# Patient Record
Sex: Female | Born: 1950 | Race: White | Hispanic: No | Marital: Married | Smoking: Never smoker
Health system: Southern US, Community
[De-identification: ages and names within clinical notes are randomized; demographics above are authoritative.]

## PROBLEM LIST (undated history)

## (undated) DIAGNOSIS — R002 Palpitations: Secondary | ICD-10-CM

## (undated) DIAGNOSIS — Z806 Family history of leukemia: Secondary | ICD-10-CM

## (undated) DIAGNOSIS — G500 Trigeminal neuralgia: Secondary | ICD-10-CM

## (undated) DIAGNOSIS — Z800 Family history of malignant neoplasm of digestive organs: Secondary | ICD-10-CM

## (undated) DIAGNOSIS — M47812 Spondylosis without myelopathy or radiculopathy, cervical region: Secondary | ICD-10-CM

## (undated) DIAGNOSIS — Z8042 Family history of malignant neoplasm of prostate: Secondary | ICD-10-CM

## (undated) DIAGNOSIS — I499 Cardiac arrhythmia, unspecified: Secondary | ICD-10-CM

## (undated) DIAGNOSIS — R197 Diarrhea, unspecified: Secondary | ICD-10-CM

## (undated) DIAGNOSIS — R1110 Vomiting, unspecified: Secondary | ICD-10-CM

## (undated) DIAGNOSIS — Z803 Family history of malignant neoplasm of breast: Secondary | ICD-10-CM

## (undated) DIAGNOSIS — K589 Irritable bowel syndrome without diarrhea: Secondary | ICD-10-CM

## (undated) DIAGNOSIS — Z8481 Family history of carrier of genetic disease: Secondary | ICD-10-CM

## (undated) DIAGNOSIS — I498 Other specified cardiac arrhythmias: Secondary | ICD-10-CM

## (undated) DIAGNOSIS — M869 Osteomyelitis, unspecified: Secondary | ICD-10-CM

## (undated) HISTORY — DX: Irritable bowel syndrome, unspecified: K58.9

## (undated) HISTORY — PX: TONSILLECTOMY: SUR1361

## (undated) HISTORY — DX: Other specified cardiac arrhythmias: I49.8

## (undated) HISTORY — DX: Osteomyelitis, unspecified: M86.9

## (undated) HISTORY — DX: Spondylosis without myelopathy or radiculopathy, cervical region: M47.812

## (undated) HISTORY — DX: Family history of malignant neoplasm of breast: Z80.3

## (undated) HISTORY — PX: TUBAL LIGATION: SHX77

## (undated) HISTORY — PX: UPPER GASTROINTESTINAL ENDOSCOPY: SHX188

## (undated) HISTORY — DX: Diarrhea, unspecified: R19.7

## (undated) HISTORY — DX: Trigeminal neuralgia: G50.0

## (undated) HISTORY — DX: Cardiac arrhythmia, unspecified: I49.9

## (undated) HISTORY — DX: Family history of carrier of genetic disease: Z84.81

## (undated) HISTORY — DX: Palpitations: R00.2

## (undated) HISTORY — PX: KNEE SURGERY: SHX244

## (undated) HISTORY — DX: Family history of leukemia: Z80.6

## (undated) HISTORY — DX: Family history of malignant neoplasm of digestive organs: Z80.0

## (undated) HISTORY — DX: Vomiting, unspecified: R11.10

## (undated) HISTORY — PX: CERVICAL DISC SURGERY: SHX588

## (undated) HISTORY — DX: Family history of malignant neoplasm of prostate: Z80.42

## (undated) HISTORY — PX: COLONOSCOPY: SHX174

---

## 1997-08-09 ENCOUNTER — Ambulatory Visit: Admission: RE | Admit: 1997-08-09 | Discharge: 1997-08-09 | Payer: Self-pay | Admitting: Gynecology

## 1997-11-26 ENCOUNTER — Encounter: Payer: Self-pay | Admitting: Neurosurgery

## 1997-11-29 ENCOUNTER — Inpatient Hospital Stay (HOSPITAL_COMMUNITY): Admission: RE | Admit: 1997-11-29 | Discharge: 1997-11-30 | Payer: Self-pay | Admitting: Neurosurgery

## 1997-11-29 ENCOUNTER — Encounter: Payer: Self-pay | Admitting: Neurosurgery

## 1997-12-01 ENCOUNTER — Inpatient Hospital Stay (HOSPITAL_COMMUNITY): Admission: EM | Admit: 1997-12-01 | Discharge: 1997-12-05 | Payer: Self-pay | Admitting: Neurosurgery

## 1998-09-22 ENCOUNTER — Encounter: Payer: Self-pay | Admitting: Gynecology

## 1998-09-22 ENCOUNTER — Ambulatory Visit (HOSPITAL_COMMUNITY): Admission: RE | Admit: 1998-09-22 | Discharge: 1998-09-22 | Payer: Self-pay | Admitting: Gynecology

## 1998-09-26 ENCOUNTER — Other Ambulatory Visit: Admission: RE | Admit: 1998-09-26 | Discharge: 1998-09-26 | Payer: Self-pay | Admitting: Gynecology

## 1999-02-17 ENCOUNTER — Encounter: Admission: RE | Admit: 1999-02-17 | Discharge: 1999-02-17 | Payer: Self-pay | Admitting: Neurosurgery

## 1999-02-17 ENCOUNTER — Encounter: Payer: Self-pay | Admitting: Neurosurgery

## 1999-04-06 ENCOUNTER — Encounter: Admission: RE | Admit: 1999-04-06 | Discharge: 1999-07-05 | Payer: Self-pay | Admitting: Anesthesiology

## 1999-09-22 ENCOUNTER — Ambulatory Visit (HOSPITAL_COMMUNITY): Admission: RE | Admit: 1999-09-22 | Discharge: 1999-09-22 | Payer: Self-pay | Admitting: Gynecology

## 1999-09-22 ENCOUNTER — Encounter: Payer: Self-pay | Admitting: Gynecology

## 1999-12-05 ENCOUNTER — Other Ambulatory Visit: Admission: RE | Admit: 1999-12-05 | Discharge: 1999-12-05 | Payer: Self-pay | Admitting: Gynecology

## 2000-04-15 ENCOUNTER — Encounter: Admission: RE | Admit: 2000-04-15 | Discharge: 2000-07-14 | Payer: Self-pay | Admitting: Anesthesiology

## 2000-10-16 ENCOUNTER — Ambulatory Visit (HOSPITAL_COMMUNITY): Admission: RE | Admit: 2000-10-16 | Discharge: 2000-10-16 | Payer: Self-pay | Admitting: Gynecology

## 2000-10-16 ENCOUNTER — Encounter: Payer: Self-pay | Admitting: Gynecology

## 2001-03-18 ENCOUNTER — Other Ambulatory Visit: Admission: RE | Admit: 2001-03-18 | Discharge: 2001-03-18 | Payer: Self-pay | Admitting: Gynecology

## 2001-11-26 ENCOUNTER — Ambulatory Visit (HOSPITAL_COMMUNITY): Admission: RE | Admit: 2001-11-26 | Discharge: 2001-11-26 | Payer: Self-pay | Admitting: Gynecology

## 2001-11-26 ENCOUNTER — Encounter: Payer: Self-pay | Admitting: Gynecology

## 2001-12-29 ENCOUNTER — Ambulatory Visit (HOSPITAL_COMMUNITY): Admission: RE | Admit: 2001-12-29 | Discharge: 2001-12-29 | Payer: Self-pay | Admitting: Gastroenterology

## 2001-12-29 ENCOUNTER — Encounter (INDEPENDENT_AMBULATORY_CARE_PROVIDER_SITE_OTHER): Payer: Self-pay | Admitting: Specialist

## 2002-07-14 ENCOUNTER — Other Ambulatory Visit: Admission: RE | Admit: 2002-07-14 | Discharge: 2002-07-14 | Payer: Self-pay | Admitting: Gynecology

## 2002-12-29 ENCOUNTER — Ambulatory Visit (HOSPITAL_COMMUNITY): Admission: RE | Admit: 2002-12-29 | Discharge: 2002-12-29 | Payer: Self-pay | Admitting: Gynecology

## 2003-07-19 ENCOUNTER — Other Ambulatory Visit: Admission: RE | Admit: 2003-07-19 | Discharge: 2003-07-19 | Payer: Self-pay | Admitting: Gynecology

## 2003-11-02 ENCOUNTER — Ambulatory Visit: Payer: Self-pay | Admitting: Sports Medicine

## 2003-11-12 ENCOUNTER — Encounter: Admission: RE | Admit: 2003-11-12 | Discharge: 2003-11-12 | Payer: Self-pay | Admitting: Sports Medicine

## 2003-11-19 ENCOUNTER — Ambulatory Visit: Payer: Self-pay | Admitting: Sports Medicine

## 2003-12-31 ENCOUNTER — Ambulatory Visit: Payer: Self-pay | Admitting: Sports Medicine

## 2004-01-18 ENCOUNTER — Ambulatory Visit (HOSPITAL_COMMUNITY): Admission: RE | Admit: 2004-01-18 | Discharge: 2004-01-18 | Payer: Self-pay | Admitting: Family Medicine

## 2004-03-15 ENCOUNTER — Encounter: Admission: RE | Admit: 2004-03-15 | Discharge: 2004-03-15 | Payer: Self-pay

## 2004-03-26 ENCOUNTER — Ambulatory Visit (HOSPITAL_COMMUNITY): Admission: RE | Admit: 2004-03-26 | Discharge: 2004-03-26 | Payer: Self-pay | Admitting: *Deleted

## 2004-07-24 ENCOUNTER — Other Ambulatory Visit: Admission: RE | Admit: 2004-07-24 | Discharge: 2004-07-24 | Payer: Self-pay | Admitting: Gynecology

## 2004-07-26 ENCOUNTER — Ambulatory Visit (HOSPITAL_COMMUNITY): Admission: RE | Admit: 2004-07-26 | Discharge: 2004-07-26 | Payer: Self-pay | Admitting: *Deleted

## 2004-07-26 ENCOUNTER — Ambulatory Visit (HOSPITAL_BASED_OUTPATIENT_CLINIC_OR_DEPARTMENT_OTHER): Admission: RE | Admit: 2004-07-26 | Discharge: 2004-07-26 | Payer: Self-pay | Admitting: *Deleted

## 2004-10-18 ENCOUNTER — Ambulatory Visit (HOSPITAL_BASED_OUTPATIENT_CLINIC_OR_DEPARTMENT_OTHER): Admission: RE | Admit: 2004-10-18 | Discharge: 2004-10-18 | Payer: Self-pay | Admitting: *Deleted

## 2004-10-18 ENCOUNTER — Ambulatory Visit (HOSPITAL_COMMUNITY): Admission: RE | Admit: 2004-10-18 | Discharge: 2004-10-18 | Payer: Self-pay | Admitting: *Deleted

## 2004-10-18 ENCOUNTER — Encounter (INDEPENDENT_AMBULATORY_CARE_PROVIDER_SITE_OTHER): Payer: Self-pay | Admitting: Specialist

## 2004-12-27 ENCOUNTER — Encounter: Admission: RE | Admit: 2004-12-27 | Discharge: 2004-12-27 | Payer: Self-pay | Admitting: Orthopaedic Surgery

## 2005-01-23 ENCOUNTER — Ambulatory Visit (HOSPITAL_COMMUNITY): Admission: RE | Admit: 2005-01-23 | Discharge: 2005-01-23 | Payer: Self-pay | Admitting: Gynecology

## 2005-02-15 ENCOUNTER — Encounter: Admission: RE | Admit: 2005-02-15 | Discharge: 2005-02-15 | Payer: Self-pay | Admitting: Rheumatology

## 2005-04-12 ENCOUNTER — Emergency Department (HOSPITAL_COMMUNITY): Admission: EM | Admit: 2005-04-12 | Discharge: 2005-04-12 | Payer: Self-pay | Admitting: Emergency Medicine

## 2005-05-23 ENCOUNTER — Encounter: Admission: RE | Admit: 2005-05-23 | Discharge: 2005-05-23 | Payer: Self-pay | Admitting: Gastroenterology

## 2006-01-28 ENCOUNTER — Ambulatory Visit (HOSPITAL_COMMUNITY): Admission: RE | Admit: 2006-01-28 | Discharge: 2006-01-28 | Payer: Self-pay | Admitting: Gynecology

## 2006-09-05 ENCOUNTER — Ambulatory Visit: Payer: Self-pay | Admitting: Oncology

## 2006-09-11 LAB — CBC WITH DIFFERENTIAL/PLATELET
BASO%: 0.4 % (ref 0.0–2.0)
Basophils Absolute: 0 10*3/uL (ref 0.0–0.1)
EOS%: 0.4 % (ref 0.0–7.0)
Eosinophils Absolute: 0 10*3/uL (ref 0.0–0.5)
HCT: 33.7 % — ABNORMAL LOW (ref 34.8–46.6)
HGB: 11.8 g/dL (ref 11.6–15.9)
LYMPH%: 21.5 % (ref 14.0–48.0)
MCH: 32.5 pg (ref 26.0–34.0)
MCHC: 35.2 g/dL (ref 32.0–36.0)
MCV: 92.3 fL (ref 81.0–101.0)
MONO#: 0.3 10*3/uL (ref 0.1–0.9)
MONO%: 4.5 % (ref 0.0–13.0)
NEUT#: 4.6 10*3/uL (ref 1.5–6.5)
NEUT%: 73.2 % (ref 39.6–76.8)
Platelets: 259 10*3/uL (ref 145–400)
RBC: 3.65 10*6/uL — ABNORMAL LOW (ref 3.70–5.32)
RDW: 13.7 % (ref 11.3–14.5)
WBC: 6.3 10*3/uL (ref 3.9–10.0)
lymph#: 1.4 10*3/uL (ref 0.9–3.3)

## 2007-02-06 ENCOUNTER — Ambulatory Visit (HOSPITAL_COMMUNITY): Admission: RE | Admit: 2007-02-06 | Discharge: 2007-02-06 | Payer: Self-pay | Admitting: Gynecology

## 2008-02-17 ENCOUNTER — Ambulatory Visit (HOSPITAL_COMMUNITY): Admission: RE | Admit: 2008-02-17 | Discharge: 2008-02-17 | Payer: Self-pay | Admitting: Family Medicine

## 2008-03-20 ENCOUNTER — Ambulatory Visit: Payer: Self-pay | Admitting: Family Medicine

## 2008-03-20 DIAGNOSIS — J069 Acute upper respiratory infection, unspecified: Secondary | ICD-10-CM | POA: Insufficient documentation

## 2008-03-20 LAB — CONVERTED CEMR LAB: Rapid Strep: NEGATIVE

## 2009-03-07 ENCOUNTER — Ambulatory Visit (HOSPITAL_COMMUNITY): Admission: RE | Admit: 2009-03-07 | Discharge: 2009-03-07 | Payer: Self-pay | Admitting: Gynecology

## 2009-03-14 ENCOUNTER — Encounter: Admission: RE | Admit: 2009-03-14 | Discharge: 2009-03-14 | Payer: Self-pay | Admitting: Gynecology

## 2009-09-14 ENCOUNTER — Encounter: Admission: RE | Admit: 2009-09-14 | Discharge: 2009-09-14 | Payer: Self-pay | Admitting: Gynecology

## 2009-11-02 ENCOUNTER — Ambulatory Visit: Payer: Self-pay | Admitting: Cardiovascular Disease

## 2010-02-15 ENCOUNTER — Other Ambulatory Visit: Payer: Self-pay | Admitting: Gynecology

## 2010-02-15 DIAGNOSIS — Z09 Encounter for follow-up examination after completed treatment for conditions other than malignant neoplasm: Secondary | ICD-10-CM

## 2010-03-15 ENCOUNTER — Ambulatory Visit
Admission: RE | Admit: 2010-03-15 | Discharge: 2010-03-15 | Disposition: A | Discharge status: Home / Self Care | Payer: BC Managed Care – PPO | Source: Ambulatory Visit | Attending: Gynecology | Admitting: Gynecology

## 2010-03-15 DIAGNOSIS — Z09 Encounter for follow-up examination after completed treatment for conditions other than malignant neoplasm: Secondary | ICD-10-CM

## 2010-05-19 NOTE — Procedures (Signed)
[facility]  Patient:    Kathleen Garcia, Kathleen Garcia                         MRN: [id number] Proc. Date: 05/01/00 Adm. Date:  [id number] Attending:  Thyra Breed CC:         Jearld Adjutant, M.D.  Marlan Palau, M.D.   Procedure Report  PREOPERATIVE DIAGNOSIS:  Cervical spondylosis with disk protrusion.  POSTOPERATIVE DIAGNOSIS:  Cervical spondylosis with disk protrusion.  PROCEDURE:  Cervical epidural steroid injection.  SURGEON:  Thyra Breed, M.D.  INTERVAL HISTORY:  The patient has noted improvement, but not as impressive with the last injection and is requesting another injection today.  She has had some sinus congestion which she feels is allergic-type problems rather than an actual infection, and she has not had any fevers.  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  PHYSICAL EXAMINATION:  The patient is afebrile and vital signs are stable. Please see flow sheet for details.  Her back shows good healing from previous injection site in her neck.  Her neurologic exam is grossly unchanged..  DESCRIPTION OF PROCEDURE:  After informed consent was obtained, the patient was placed in the sitting position, and her neck flexed forward.  Monitors were placed.  I prepped her neck with alcohol.  The skin level was raised at the C7-T1 interspace with 1% lidocaine.  A 20-gauge Tuohy needle was introduced into the cervical epidural space with loss of resistance to preservative-free normal saline.  There is no CSF nor blood.  Forty milligrams of Medrol and 3 ml of preservative-free normal saline was gently injected. The needle was flushed and removed intact.  POSTPROCEDURE CONDITION:  Stable.  DISCHARGE INSTRUCTIONS: 1. Resume previous diet. 2. Limitation and activities per instruction sheet. 3. Continue with the Vioxx 25 mg one to two p.o. q.d., #60 with two refills. 4. Followup with me for a third injection if this one does not significantly   reduce her discomfort. DD:  05/01/00 TD:  05/02/00 Job: [id number]

## 2010-05-19 NOTE — Procedures (Signed)
[facility]  Patient:    Kathleen Garcia, Kathleen Garcia                         MRN: [id number] Proc. Date: 04/16/00 Adm. Date:  [id number] Attending:  Thyra Breed CC:         Jearld Adjutant, M.D.  Marlan Palau, M.D.   Procedure Report  PROCEDURE:  Cervical epidural steroid injection.  DIAGNOSIS:  Cervical spondylosis with disk protrusions.  INTERVAL HISTORY:  The patients done remarkably well up until about three weeks ago when she began to have recurrence of symptoms into her right upper extremity. She states that these are the identical symptoms she has had before. She denied any new weakness or bowel or bladder incontinence. She does have a deep dull ache in her right upper extremity but minimal tingling.  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  PHYSICAL EXAMINATION:  Blood pressure 122/49, heart rate 84, respiratory rate 16, O2 saturations 100%, pain level is 8/10. Range of motion of her neck is unchanged from previously and is relatively intact. Deep tendon reflexes were symmetric in the upper extremities with no clonus. Motor is 5/5 in the upper extremities, sensory is grossly intact.  DESCRIPTION OF PROCEDURE:  After informed consent was obtained, the patient was placed in the sitting position with her neck flexed forward and monitored. I prepped her neck with alcohol.  A skin wheal was raised at the C7-T1 interspace with 1 percent lidocaine. A 20 gauge Tuohy needle was introduced to the cervical epidural space to loss of resistance to preservative free normal saline. There was no cerebrospinal fluid nor blood. 40 mg of Medrol and 3 cc of preservative free normal saline was gently injected. The needle was flushed with preservative free normal saline and removed intact.  CONDITION POST PROCEDURE:  Stable.  DISCHARGE INSTRUCTIONS:  Resume previous diet. Limitations in activities per instruction sheet. Continue on  current medications. The patient was advised that we would see her back on an as needed basis. DD:  04/16/00 TD:  04/17/00 Job: [id number]

## 2010-05-19 NOTE — Op Note (Signed)
NAME:  Kathleen Garcia, Kathleen Garcia                            ACCOUNT NO.:  [id number]   MEDICAL RECORD NO.:  [id number]                   PATIENT TYPE:  AMB   LOCATION:  ENDO                                 FACILITY:  [facility]   PHYSICIAN:  Bernette Redbird, M.D.                DATE OF BIRTH:  07/18/1950   DATE OF PROCEDURE:  DATE OF DISCHARGE:                                 OPERATIVE REPORT   PROCEDURE:  Colonoscopy with biopsies.   INDICATIONS:  This is Garcia 60 year old female who needs colon cancer screening  but also has Garcia history of significant diarrhea, alternating with occasional  episodes of constipation, rule out microscopic colitis.   ALLERGIES:  THE PATIENT HAS Garcia HISTORY OF Garcia LATEX ALLERGY, SO LATEX FREE  EQUIPMENT AND GLOVES WERE USED DURING THIS PROCEDURE.   FINDINGS:  Scattered diverticulosis, otherwise normal to terminal ileum.   PROCEDURE:  The nature, purpose and risks of the procedure have been  discussed with the patient and she provided written consent. Sedation for  this procedure and the upper endoscopy which preceded it, total fentanyl 50  mcg and Versed 8 mg IV without arrhythmias or desaturation. The patient  maintain Garcia fairly low blood pressure both prior to and during the  examination without any evidence of clinical instability.   The Olympus adjustable tension pediatric video colonoscope was advanced  around the colon to the terminal ileum, encountering some degree of  angulation and fixation in the sigmoid region. I used external abdominal  compression to control looping.   Garcia couple of biopsies were obtained from the terminal ileum prior to  pullback. The terminal ileum did have Garcia normal appearance. There was some  scattered diverticulosis, especially in the distal colon. Otherwise, this  was Garcia normal examination. I did not see any evidence of colitis,  specifically, I did not that the vascular pattern of the colonic mucosa was  normal in appearance. There was no  evidence of vascular ectasia nor any  polyps or masses.   Retroflexion was not performed in the rectum due to Garcia small rectal ampulla,  but careful antegrade viewing of the distal rectum disclosed no lesions, and  I did reinspect the rectosigmoid without any additional findings.  The  quality of the prep was excellent, so it was felt that all areas were well  seen during this examination.   IMPRESSION:  Left-sided diverticulosis, otherwise unremarkable examination.   PLAN:  Await pathology on random mucosal biopsies obtained along the length  of the colon and in the terminal ileum to look for any source of the  patient's diarrhea.  Bernette Redbird, M.D.   RB/MEDQ  D:  12/29/2001  T:  12/29/2001  Job:  [id number]

## 2010-05-19 NOTE — Op Note (Signed)
NAME:  Kathleen Garcia, Kathleen Garcia                  ACCOUNT NO.:  [id number]   MEDICAL RECORD NO.:  [id number]          PATIENT TYPE:  AMB   LOCATION:  DSC                          FACILITY:  [facility]   PHYSICIAN:  Tennis Must Meyerdierks, M.D.DATE OF BIRTH:  1950-11-18   DATE OF PROCEDURE:  07/26/2004  DATE OF DISCHARGE:                                 OPERATIVE REPORT   PREOPERATIVE DIAGNOSES:  Osteoarthritis DIP joint right index finger.   PAST OBSTETRICAL HISTORY:  Osteoarthritis DIP joint right index finger.   PROCEDURE:  Arthrodesis of distal interphalangeal joint right index finger.   SURGEON:  Shirlee Latch, M.D.   ANESTHESIA:  General.   OPERATIVE FINDINGS:  The patient had complete loss of the articular surface  of both the middle phalanx and the distal phalanx at the DIP joint.  There  were no cysts that had formed and the bone was of good quality.   PROCEDURE:  Under general anesthesia with a tourniquet on the right arm the  right hand was prepped and draped in the usual fashion.  After  exsanguinating the limb the tourniquet was inflated to 250 mmHg.  A Y-shaped  incision was made over the dorsum of the DIP joint of the right index  finger.  Sharp dissection was carried down through the extensor tendon to  the bone.  Lateral ligaments were released sharply and the DIP joint was  fully flexed.  A small saw was then used to remove the very distal remaining  cartilage and eburnated bone from the end of the middle phalanx.  Likewise,  saw was used to remove the proximal end of the distal phalanx.  The ends of  the bone were then pushed together showing position of about 10 degrees of  flexion and three K-wires were used to cross the previous DIP joint, holding  it in position to allow it to heal and fuse.  A 4-5 K-wire was used  longitudinally and two 3-5 K-wires were placed obliquely across the joint.  The pins were bent over and left protruding from the skin.  The wound was  irrigated copiously with saline.  X-rays showed good position of the fusion  site.  The extensor mechanism was  repaired with a 4-0 Vicryl and the skin with a 4-0 nylon.  Sterile dressings  were applied followed by an Alumafoam splint.  The tourniquet was released  with good circulation of the digit.  0.5% Marcaine digital block was  performed for pain control.  The patient went to the recovery room awake,  stable, in good condition.       EMM/MEDQ  D:  07/26/2004  T:  07/26/2004  Job:  [id number]   cc:   Areatha Keas, M.D.  [address]  [address]  [place]  [place] [address]  Fax: [phone]   Duncan Dull, M.D.  [address]  [address]  [place]  [place] [address]  Fax: [phone]

## 2010-05-19 NOTE — Op Note (Signed)
NAME:  Lagoy, Yilin A                            ACCOUNT NO.:  [id number]   MEDICAL RECORD NO.:  [id number]                   PATIENT TYPE:  AMB   LOCATION:  ENDO                                 FACILITY:  [facility]   PHYSICIAN:  Bernette Redbird, M.D.                DATE OF BIRTH:  [birth year]   DATE OF PROCEDURE:  12/29/2001  DATE OF DISCHARGE:                                 OPERATIVE REPORT   PROCEDURE PERFORMED:  Upper endoscopy with biopsies.   ENDOSCOPIST:  Florencia Reasons, M.D.   INDICATIONS FOR PROCEDURE:  The patient is a 60 year old female with  nonspecific upper and lower tract symptoms.  She has pressure on her stomach  which has only partly been relieved by the use of Protonix.  She does not  really have classic reflux symptomatology.  She does have some trouble  swallowing.  This has been especially prominent since the cervical  laminectomy.   FINDINGS:  Unremarkable exam.   DESCRIPTION OF PROCEDURE:  The nature, purpose and risks of the procedure  had been discussed with the patient, who provided written consent.   The patient has a history of a latex allergy.  A latex free protocol was  used for this procedure.   Sedation was fentanyl 50 mcg and Versed 8 mg IV without arrhythmias or  desaturation.  The Olympus small caliber adult video endoscope was passed  under direct vision.  The vocal cords looked normal.  The larynx was  unremarkable, perhaps slightly edematous.   The esophageal was quite readily entered under direct vision with perhaps a  little bit of momentary resistance but no discrete ring or stricture was  identified.  The esophagus was normal.  There was no reflux esophagitis,  Barrett's esophagus, varices, infection, neoplasia, ring, stricture or  hiatal hernia, or evident esophageal spasm could be identified.   The stomach contained essentially no residual and had normal mucosa without  evidence of gastritis, erosions, ulcers, polyps or masses and a  retroflex  view of the proximal stomach showed no hiatal hernia or other abnormalities  in the cardia of the stomach.  The pylorus, duodenal bulb and second  duodenum looked normal but duodenal biopsies were obtained to help rule out  celiac disease prior to removal of the scope.  The patient tolerated the  procedure well and there were no apparent complications.   IMPRESSION:  Endoscopically normal upper tract.    PLAN:  1. Await pathology on duodenal biopsies.  2. If the swallowing symptoms persist, consideration could be given to a     barium swallow with a barium tablet looking for cervical esophageal web     which might be missed endoscopically.  Bernette Redbird, M.D.    RB/MEDQ  D:  12/29/2001  T:  12/29/2001  Job:  [id number]

## 2010-05-19 NOTE — Op Note (Signed)
NAME:  Kathleen Garcia, Kathleen Garcia                  ACCOUNT NO.:  [id number]   MEDICAL RECORD NO.:  [id number]          PATIENT TYPE:  AMB   LOCATION:  DSC                          FACILITY:  [facility]   PHYSICIAN:  Tennis Must Meyerdierks, M.D.DATE OF BIRTH:  April 11, 1950   DATE OF PROCEDURE:  10/18/2004  DATE OF DISCHARGE:                                 OPERATIVE REPORT   PREOP DIAGNOSIS:  Osteomyelitis middle phalanx, right index finger.   POSTOP DIAGNOSIS:  Osteomyelitis middle phalanx, right index finger.   PROCEDURE:  Incision and drainage middle phalanx, right index finger.   SURGEON:  Lowell Bouton, M.D.   ANESTHESIA:  General.   OPERATIVE FINDINGS:  The patient had no gross purulent material. However,  there was a hole in the dorsum of the bone.   PROCEDURE:  Under general anesthesia with a tourniquet on the right arm, the  right hand was prepped and draped in usual fashion.  After elevating the  limb, the tourniquet was inflated to 250 mmHg. A longitudinal incision was  made over the dorsum of middle phalanx down to the bone through the extensor  mechanism. There was a hole in the bone and this was curetted and cultures  were sent to the lab. The medullary canal was then irrigated with saline  using a 23 gauge needle. It was packed open with Iodoform packing and 4-0  nylon suture was used to close the skin edges. Sterile dressings were  applied and a 0.5% Marcaine digital block was inserted for pain control. The  patient tolerated procedure well and went to the recovery room awake and  stable in good condition.      Lowell Bouton, M.D.  Electronically Signed     EMM/MEDQ  D:  10/18/2004  T:  10/18/2004  Job:  [id number]

## 2010-11-17 ENCOUNTER — Encounter: Payer: Self-pay | Admitting: Cardiovascular Disease

## 2010-11-22 ENCOUNTER — Ambulatory Visit: Payer: BC Managed Care – PPO | Admitting: Cardiovascular Disease

## 2010-11-22 ENCOUNTER — Encounter: Payer: Self-pay | Admitting: Cardiovascular Disease

## 2010-11-22 ENCOUNTER — Ambulatory Visit (INDEPENDENT_AMBULATORY_CARE_PROVIDER_SITE_OTHER): Payer: BC Managed Care – PPO | Admitting: Cardiovascular Disease

## 2010-11-22 VITALS — BP 113/65 | HR 74 | Ht 61.0 in | Wt 123.0 lb

## 2010-11-22 DIAGNOSIS — R002 Palpitations: Secondary | ICD-10-CM

## 2010-11-22 MED ORDER — METOPROLOL SUCCINATE ER 50 MG PO TB24: 50.0000 mg | ORAL_TABLET | Freq: Every day | ORAL | Status: DC

## 2010-11-22 NOTE — Progress Notes (Signed)
    Kathleen Garcia Date of Birth  23-Dec-1950 [facility] [address]    [address] [place], [place]  [address] [phone]  Fax  [phone]  History of Present Illness:  Kathleen Garcia is a 60 y.o. female with a hx of palpitations.  She has been well controlled on Metoprolol.  Current Outpatient Prescriptions on File Prior to Visit  Medication Sig Dispense Refill  . CALCIUM PO Take 1,500 mg by mouth.        . cetirizine (ZYRTEC) 10 MG tablet Take 10 mg by mouth daily.        . Cholecalciferol (VITAMIN D PO) Take 400 Units by mouth.        . metoprolol (TOPROL-XL) 50 MG 24 hr tablet Take 50 mg by mouth daily.          Allergies  Allergen Reactions  . Betadine (Povidone Iodine)   . Morphine   . Phenergan   . Povidone-Iodine     Past Medical History  Diagnosis Date  . Palpitation   . Trigeminal neuralgia   . Chest pain   . Fluttering heart   . Irritable bowel syndrome   . Irregular heart rate   . Osteoporosis   . Diarrhea     The history was provided by the patient  . Vomiting     The history was provided by the patient  . Osteomyelitis     middle phalanx, right index finger  . Cervical spondylosis      with disk protrusion    Past Surgical History  Procedure Date  . Knee surgery   . Tonsillectomy   . Cervical disc surgery   . Tubal ligation   . Colonoscopy     with biopsies  . Upper gastrointestinal endoscopy      with biopsies    History  Smoking status  . Never Smoker   Smokeless tobacco  . Not on file    History  Alcohol Use No    Family History  Problem Relation Age of Onset  . Hypertension Mother   . Coronary artery disease Father     Reviw of Systems:  Reviewed in the HPI.  All other systems are negative.  Physical Exam: BP 113/65  Pulse 74  Ht 5\' 1"  (1.549 m)  Wt 123 lb (55.792 kg)  BMI 23.24 kg/m2 The patient is alert and oriented x 3.  The mood and affect are normal.   Skin: warm and dry.  Color is normal.    HEENT:    Comern­o/AT, normal carotids, no JVD  Lungs: clear    Heart: RR, no murmurs    Abdomen: + BS, soft, no HSM  Extremities:  No C/C/E  Neuro:  Non focal    ECG: NSR, normal ECG  Assessment / Plan:

## 2010-11-22 NOTE — Assessment & Plan Note (Signed)
She has done well since I last saw her 1 year ago.  We will continue her current medications.  We will refill her Toprol XL 50 mg daily.  Will see her in 1 year.

## 2010-11-22 NOTE — Patient Instructions (Signed)
Your physician wants you to follow-up in: 1 year. You will receive a reminder letter in the mail two months in advance. If you don't receive a letter, please call our office to schedule the follow-up appointment.  

## 2010-12-17 ENCOUNTER — Other Ambulatory Visit: Payer: Self-pay | Admitting: Cardiovascular Disease

## 2011-02-08 ENCOUNTER — Other Ambulatory Visit: Payer: Self-pay | Admitting: Gynecology

## 2011-02-08 DIAGNOSIS — R921 Mammographic calcification found on diagnostic imaging of breast: Secondary | ICD-10-CM

## 2011-02-19 ENCOUNTER — Other Ambulatory Visit: Payer: Self-pay | Admitting: Gynecology

## 2011-03-30 ENCOUNTER — Ambulatory Visit
Admission: RE | Admit: 2011-03-30 | Discharge: 2011-03-30 | Disposition: A | Discharge status: Home / Self Care | Payer: BC Managed Care – PPO | Source: Ambulatory Visit | Attending: Gynecology | Admitting: Gynecology

## 2011-03-30 DIAGNOSIS — R921 Mammographic calcification found on diagnostic imaging of breast: Secondary | ICD-10-CM

## 2011-12-11 ENCOUNTER — Ambulatory Visit: Payer: BC Managed Care – PPO | Admitting: Cardiovascular Disease

## 2011-12-24 ENCOUNTER — Other Ambulatory Visit: Payer: Self-pay | Admitting: *Deleted

## 2011-12-24 MED ORDER — METOPROLOL SUCCINATE ER 50 MG PO TB24: 50.0000 mg | ORAL_TABLET | Freq: Every day | ORAL | Status: DC

## 2011-12-24 NOTE — Telephone Encounter (Signed)
Pt needs appointment then refill can be made Fax Received. Refill Completed. Kathleen Garcia (R.M.A)   

## 2011-12-28 ENCOUNTER — Encounter: Payer: Self-pay | Admitting: Sports Medicine

## 2011-12-28 ENCOUNTER — Ambulatory Visit (INDEPENDENT_AMBULATORY_CARE_PROVIDER_SITE_OTHER): Payer: BC Managed Care – PPO | Admitting: Sports Medicine

## 2011-12-28 VITALS — BP 113/60 | HR 84 | Wt 126.0 lb

## 2011-12-28 DIAGNOSIS — G4762 Sleep related leg cramps: Secondary | ICD-10-CM

## 2011-12-28 DIAGNOSIS — M47816 Spondylosis without myelopathy or radiculopathy, lumbar region: Secondary | ICD-10-CM | POA: Insufficient documentation

## 2011-12-28 DIAGNOSIS — M5432 Sciatica, left side: Secondary | ICD-10-CM | POA: Insufficient documentation

## 2011-12-28 DIAGNOSIS — Z0000 Encounter for general adult medical examination without abnormal findings: Secondary | ICD-10-CM | POA: Insufficient documentation

## 2011-12-28 DIAGNOSIS — M5136 Other intervertebral disc degeneration, lumbar region: Secondary | ICD-10-CM | POA: Insufficient documentation

## 2011-12-28 DIAGNOSIS — R002 Palpitations: Secondary | ICD-10-CM

## 2011-12-28 DIAGNOSIS — N393 Stress incontinence (female) (male): Secondary | ICD-10-CM

## 2011-12-28 DIAGNOSIS — Z299 Encounter for prophylactic measures, unspecified: Secondary | ICD-10-CM

## 2011-12-28 DIAGNOSIS — M5416 Radiculopathy, lumbar region: Secondary | ICD-10-CM | POA: Insufficient documentation

## 2011-12-28 MED ORDER — CYCLOBENZAPRINE HCL 10 MG PO TABS: ORAL_TABLET | ORAL | Status: DC

## 2011-12-28 NOTE — Assessment & Plan Note (Signed)
Up-to-date on mammogram, colonoscopy, Pap smears. We will revisit immunizations at a future visit. Checking some routine blood work.

## 2011-12-28 NOTE — Assessment & Plan Note (Signed)
Has been working very hard with Kegel exercises. Unfortunately symptoms have been persistent. She will make her own appointment with [facility].

## 2011-12-28 NOTE — Assessment & Plan Note (Signed)
Stable on Toprol. Will follow this up with Dr. Elease Hashimoto.

## 2011-12-28 NOTE — Patient Instructions (Addendum)
Hip Rehabilitation Protocol:  1.  Side leg raises.  3x30 with no weight, then 3x15 with 2 lb ankle weight, then 3x15 with 5 lb ankle weight 2.  Standing hip rotation.  3x30 with no weight, then 3x15 with 2 lb ankle weight, then 3x15 with 5 lb ankle weight. 3.  Side step ups.  3x30 with no weight, then 3x15 with 5 lbs in backpack, then 3x15 with 10 lbs in backpack. 

## 2011-12-28 NOTE — Progress Notes (Signed)
Subjective:    CC: Establish care.   HPI:  Incontinence: For a [place] time has experienced leakage with coughing, or sneezing. She has been working on pelvic floor rehabilitation exercises for a Cuello time now. Unfortunately she still has leakage and this does not improve. She denies any sudden urges to void, and denies leaking without her knowledge. Wondering if there is anything else that can be done.  Leg cramps: Occasionally has pain that she localizes from deep in the left buttock radiating down the posterior aspect of her left leg down to her foot. She is also occasionally woken up by cramping predominantly in the left calf. She was seen to [facility] where x-rays of her lumbar spine were essentially unremarkable.  She had what sounds like a trigger point injection that relieved her symptoms. She has never done any formal rehabilitation.  She denies much back pain.  Palpitations: Is currently seen by a cardiologist, has been on Toprol-XL for a Paletta time, and symptoms are very well-controlled  Past medical history, Surgical history, Family history, Social history, Allergies, and medications have been entered into the medical record, reviewed, and no changes needed.   Review of Systems: No headache, visual changes, nausea, vomiting, diarrhea, constipation, dizziness, abdominal pain, skin rash, fevers, chills, night sweats, swollen lymph nodes, weight loss, chest pain, body aches, joint swelling, muscle aches, shortness of breath, mood changes, visual or auditory hallucinations.  Objective:    General: Well Developed, well nourished, and in no acute distress.  Neuro: Alert and oriented x3, extra-ocular muscles intact.  HEENT: Normocephalic, atraumatic, pupils equal round reactive to light, neck supple, no masses, no lymphadenopathy, thyroid nonpalpable.  Skin: Warm and dry, no rashes noted.  Cardiac: Regular rate and rhythm, no murmurs rubs or gallops.  Respiratory: Clear to  auscultation bilaterally. Not using accessory muscles, speaking in full sentences.  Abdominal: Soft, nontender, nondistended, positive bowel sounds, no masses, no organomegaly.  Back Exam:  Inspection: Unremarkable  Motion: Flexion 45 deg, Extension 45 deg, Side Bending to 45 deg bilaterally,  Rotation to 45 deg bilaterally  SLR laying: Negative  XSLR laying: Negative  Palpable tenderness: None. She does have reproduction of her sciatic symptoms with deep palpation into the left sciatic notch. FABER: negative. Sensory change: Gross sensation intact to all lumbar and sacral dermatomes.  Reflexes: 2+ at both patellar tendons, 2+ at achilles tendons, Babinski's downgoing.  Strength at foot  Plantar-flexion: 5/5 Dorsi-flexion: 5/5 Eversion: 5/5 Inversion: 5/5  Leg strength  Quad: 5/5 Hamstring: 5/5 Hip flexor: 5/5 , left Hip abductors: 4-/5  Gait unremarkable. Hip abductor strength is significantly weak on the left side.   Impression and Recommendations:    The patient was counselled, risk factors were discussed, anticipatory guidance given.

## 2011-12-28 NOTE — Assessment & Plan Note (Signed)
Sounds like imaging of her lumbar spine at [facility] has been negative. 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 Hip abductors are also very weak. I would like her to work on some piriformis stretches as well as strengthening exercises. We will also do cyclobenzaprine at bedtime. We will revisit this in 4 weeks.

## 2011-12-29 LAB — COMPREHENSIVE METABOLIC PANEL WITH GFR
AST: 19 U/L (ref 0–37)
Albumin: 4.1 g/dL (ref 3.5–5.2)
Alkaline Phosphatase: 62 U/L (ref 39–117)
BUN: 9 mg/dL (ref 6–23)
Calcium: 9.3 mg/dL (ref 8.4–10.5)
Creat: 0.65 mg/dL (ref 0.50–1.10)
Glucose, Bld: 82 mg/dL (ref 70–99)
Potassium: 4.1 meq/L (ref 3.5–5.3)

## 2011-12-29 LAB — LIPID PANEL
Cholesterol: 169 mg/dL (ref 0–200)
HDL: 59 mg/dL (ref >39)
LDL Cholesterol: 95 mg/dL (ref 0–99)
Total CHOL/HDL Ratio: 2.9 Ratio
Triglycerides: 75 mg/dL (ref <150)
VLDL: 15 mg/dL (ref 0–40)

## 2011-12-29 LAB — CBC
HCT: 37.2 % (ref 36.0–46.0)
Hemoglobin: 12.9 g/dL (ref 12.0–15.0)
MCH: 30.9 pg (ref 26.0–34.0)
MCHC: 34.7 g/dL (ref 30.0–36.0)
MCV: 89 fL (ref 78.0–100.0)
Platelets: 286 10*3/uL (ref 150–400)
RBC: 4.18 MIL/uL (ref 3.87–5.11)
RDW: 13.5 % (ref 11.5–15.5)
WBC: 6.9 10*3/uL (ref 4.0–10.5)

## 2011-12-29 LAB — COMPREHENSIVE METABOLIC PANEL
ALT: 10 U/L (ref 0–35)
CO2: 26 mEq/L (ref 19–32)
Chloride: 105 mEq/L (ref 96–112)
Sodium: 141 mEq/L (ref 135–145)
Total Bilirubin: 0.5 mg/dL (ref 0.3–1.2)
Total Protein: 6.7 g/dL (ref 6.0–8.3)

## 2011-12-29 LAB — TSH: TSH: 0.792 u[IU]/mL (ref 0.350–4.500)

## 2011-12-29 LAB — VITAMIN D 25 HYDROXY (VIT D DEFICIENCY, FRACTURES): Vit D, 25-Hydroxy: 55 ng/mL (ref 30–89)

## 2012-01-21 ENCOUNTER — Encounter: Payer: Self-pay | Admitting: Sports Medicine

## 2012-01-21 ENCOUNTER — Ambulatory Visit (INDEPENDENT_AMBULATORY_CARE_PROVIDER_SITE_OTHER): Payer: BC Managed Care – PPO | Admitting: Sports Medicine

## 2012-01-21 ENCOUNTER — Encounter: Payer: Self-pay | Admitting: Cardiovascular Disease

## 2012-01-21 ENCOUNTER — Ambulatory Visit (INDEPENDENT_AMBULATORY_CARE_PROVIDER_SITE_OTHER): Payer: BC Managed Care – PPO | Admitting: Cardiovascular Disease

## 2012-01-21 VITALS — BP 106/58 | HR 83 | Wt 128.0 lb

## 2012-01-21 VITALS — BP 122/60 | HR 74 | Resp 18 | Ht 61.0 in | Wt 127.1 lb

## 2012-01-21 DIAGNOSIS — M5430 Sciatica, unspecified side: Secondary | ICD-10-CM

## 2012-01-21 DIAGNOSIS — R002 Palpitations: Secondary | ICD-10-CM

## 2012-01-21 DIAGNOSIS — Z299 Encounter for prophylactic measures, unspecified: Secondary | ICD-10-CM

## 2012-01-21 DIAGNOSIS — G4762 Sleep related leg cramps: Secondary | ICD-10-CM

## 2012-01-21 NOTE — Patient Instructions (Addendum)
Your physician wants you to follow-up in: 1 YEAR.  You will receive a reminder letter in the mail two months in advance. If you don't receive a letter, please call our office to schedule the follow-up appointment.  Your physician recommends that you continue on your current medications as directed. Please refer to the Current Medication list given to you today.  

## 2012-01-21 NOTE — Assessment & Plan Note (Signed)
Kathleen Garcia is doing very well. She's tolerating the Toprol XL quite well. We will continue with her same medications. I'll see her again in one year.

## 2012-01-21 NOTE — Progress Notes (Signed)
Kathleen Garcia Date of Birth  02-11-1950 [facility] [address]    [address] [place], [place]  [address] [phone]  Fax  [phone]  Problems: 1. Palpitations  History of Present Illness:  Kathleen Garcia is a 62 y.o. female with a hx of palpitations.  She has been well controlled on Metoprolol.  She has occasional episodes of palpitations that sound like PVCs.  Current Outpatient Prescriptions on File Prior to Visit  Medication Sig Dispense Refill  . CALCIUM PO Take 1,500 mg by mouth.        . cetirizine (ZYRTEC) 10 MG tablet Take 10 mg by mouth daily.        . Cholecalciferol (VITAMIN D PO) Take 2,000 Units by mouth.       . cyclobenzaprine (FLEXERIL) 10 MG tablet 1/2-1 tab by mouth each bedtime  30 tablet  0  . estradiol-norethindrone (ACTIVELLA) 1-0.5 MG per tablet Take 1 tablet by mouth daily.        . metoprolol succinate (TOPROL-XL) 50 MG 24 hr tablet Take 1 tablet (50 mg total) by mouth daily. Take with or immediately following a meal.  90 tablet  0  . montelukast (SINGULAIR) 10 MG tablet Take 10 mg by mouth at bedtime.        . ranitidine (ZANTAC) 150 MG capsule Take 150 mg by mouth daily.          Allergies  Allergen Reactions  . Betadine (Povidone Iodine)   . Morphine   . Povidone   . Promethazine Hcl     Past Medical History  Diagnosis Date  . Palpitation   . Trigeminal neuralgia   . Chest pain   . Fluttering heart   . Irritable bowel syndrome   . Irregular heart rate   . Osteoporosis   . Diarrhea     The history was provided by the patient  . Vomiting     The history was provided by the patient  . Osteomyelitis     middle phalanx, right index finger  . Cervical spondylosis      with disk protrusion    Past Surgical History  Procedure Date  . Knee surgery   . Tonsillectomy   . Cervical disc surgery   . Tubal ligation   . Colonoscopy     with biopsies  . Upper gastrointestinal endoscopy      with biopsies    History  Smoking status   . Never Smoker   Smokeless tobacco  . Not on file    History  Alcohol Use No    Family History  Problem Relation Age of Onset  . Hypertension Mother   . Coronary artery disease Father     Reviw of Systems:  Reviewed in the HPI.  All other systems are negative.  Physical Exam: BP 122/60  Pulse 74  Resp 18  Ht 5\' 1"  (1.549 m)  Wt 127 lb 1.9 oz (57.661 kg)  BMI 24.02 kg/m2  SpO2 96% The patient is alert and oriented x 3.  The mood and affect are normal.   Skin: warm and dry.  Color is normal.    HEENT:   [place]/AT, normal carotids, no JVD  Lungs: clear    Heart: RR, no murmurs    Abdomen: + BS, soft, no HSM  Extremities:  No C/C/E  Neuro:  Non focal    ECG: 01/21/2012: Normal sinus rhythm at 70 beats per minute. She has no ST or T wave  changes.  Assessment / Plan:

## 2012-01-21 NOTE — Assessment & Plan Note (Signed)
Up-to-date on influenza and Tdap. She is going to think about the shingles vaccine.

## 2012-01-21 NOTE — Progress Notes (Signed)
SPORTS MEDICINE CONSULTATION REPORT  Subjective:    CC: Left-sided sciatica.  HPI: Left-sided pain radiating down leg: I placed her on some very specific rehabilitation exercises, as well as added cyclobenzaprine at bedtime. She notes that her symptoms are at least 50% improved. She sleeps at night without symptoms, and only develops symptoms in the afternoons at the end of work. She continues to complain of pain located in the left buttock radiating down her left leg and S1 versus L5 type distribution. The symptoms are moderate.  Past medical history, Surgical history, Family history not pertinant except as noted below, Social history, Allergies, and medications have been entered into the medical record, reviewed, and no changes needed.   Review of Systems: No headache, visual changes, nausea, vomiting, diarrhea, constipation, dizziness, abdominal pain, skin rash, fevers, chills, night sweats, weight loss, swollen lymph nodes, body aches, joint swelling, muscle aches, chest pain, shortness of breath, mood changes, visual or auditory hallucinations.   Objective:   Vitals:  Afebrile, vital signs stable. General: Well Developed, well nourished, and in no acute distress.  Neuro/Psych: Alert and oriented x3, extra-ocular muscles intact, able to move all 4 extremities, sensation grossly intact. Skin: Warm and dry, no rashes noted.  Respiratory: Not using accessory muscles, speaking in full sentences, trachea midline.  Cardiovascular: Pulses palpable, no extremity edema. Abdomen: Does not appear distended. Hip abductor strength is significantly improved and symmetric bilaterally.  Impression and Recommendations:   This case required medical decision making of moderate complexity.

## 2012-01-21 NOTE — Assessment & Plan Note (Signed)
50% improved with conservative treatment including home exercises and cyclobenzaprine. We will add one half of a cyclobenzaprine tablets in the afternoon, and then one at bedtime. We will revisit this in 4-6 weeks, and if symptoms are still persistent I would like to pull the trigger for an MRI of the lumbar spine.

## 2012-02-04 ENCOUNTER — Other Ambulatory Visit: Payer: Self-pay

## 2012-02-04 MED ORDER — METOPROLOL SUCCINATE ER 50 MG PO TB24: 50.0000 mg | ORAL_TABLET | Freq: Every day | ORAL | Status: DC

## 2012-02-18 ENCOUNTER — Other Ambulatory Visit: Payer: Self-pay | Admitting: Gynecology

## 2012-02-18 DIAGNOSIS — Z1231 Encounter for screening mammogram for malignant neoplasm of breast: Secondary | ICD-10-CM

## 2012-02-20 ENCOUNTER — Ambulatory Visit: Payer: BC Managed Care – PPO | Admitting: Sports Medicine

## 2012-03-04 ENCOUNTER — Other Ambulatory Visit: Payer: Self-pay | Admitting: Sports Medicine

## 2012-03-19 ENCOUNTER — Ambulatory Visit (INDEPENDENT_AMBULATORY_CARE_PROVIDER_SITE_OTHER): Payer: BC Managed Care – PPO

## 2012-03-19 ENCOUNTER — Encounter: Payer: Self-pay | Admitting: Sports Medicine

## 2012-03-19 ENCOUNTER — Ambulatory Visit (INDEPENDENT_AMBULATORY_CARE_PROVIDER_SITE_OTHER): Payer: BC Managed Care – PPO | Admitting: Sports Medicine

## 2012-03-19 VITALS — BP 117/61 | HR 92 | Wt 128.0 lb

## 2012-03-19 DIAGNOSIS — M5030 Other cervical disc degeneration, unspecified cervical region: Secondary | ICD-10-CM

## 2012-03-19 DIAGNOSIS — M79671 Pain in right foot: Secondary | ICD-10-CM | POA: Insufficient documentation

## 2012-03-19 DIAGNOSIS — M948X9 Other specified disorders of cartilage, unspecified sites: Secondary | ICD-10-CM

## 2012-03-19 DIAGNOSIS — M5412 Radiculopathy, cervical region: Secondary | ICD-10-CM

## 2012-03-19 DIAGNOSIS — M79609 Pain in unspecified limb: Secondary | ICD-10-CM

## 2012-03-19 MED ORDER — TRAMADOL HCL 50 MG PO TABS: 50.0000 mg | ORAL_TABLET | Freq: Three times a day (TID) | ORAL | Status: DC | PRN

## 2012-03-19 MED ORDER — PREDNISONE 10 MG PO TABS: ORAL_TABLET | ORAL | Status: DC

## 2012-03-19 MED ORDER — GABAPENTIN 300 MG PO CAPS: ORAL_CAPSULE | ORAL | Status: DC

## 2012-03-19 NOTE — Progress Notes (Signed)
  Subjective:    CC: Followup  HPI: Sciatica: Resolved.  Pain in right foot: Localized over the proximal shaft of the fifth metatarsal. 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 Pain is localized, doesn't radiate, worse with weightbearing and at the end of the day. No swelling, no bruising, no acute trauma.  Right arm numbness: Has a history of a C5-C6 ACDF, more recently has had pain in her neck and numbness that she localized on the right arm in a C7 distribution. She describes extremely classically down the back of the right arm and causing numbness and tingling into the third and fourth fingers. Symptoms are moderate, worsening.  Past medical history, Surgical history, Family history not pertinant except as noted below, Social history, Allergies, and medications have been entered into the medical record, reviewed, and no changes needed.   Review of Systems: No headache, visual changes, nausea, vomiting, diarrhea, constipation, dizziness, abdominal pain, skin rash, fevers, chills, night sweats, weight loss, swollen lymph nodes, body aches, joint swelling, muscle aches, chest pain, shortness of breath, mood changes, visual or auditory hallucinations.   Objective:   General: Well Developed, well nourished, and in no acute distress.  Neuro/Psych: Alert and oriented x3, extra-ocular muscles intact, able to move all 4 extremities, sensation grossly intact. Skin: Warm and dry, no rashes noted.  Respiratory: Not using accessory muscles, speaking in full sentences, trachea midline.  Cardiovascular: Pulses palpable, no extremity edema. Abdomen: Does not appear distended. Right foot is tender to palpation over the proximal shaft of the fifth metatarsal. I think I can feel some bony callus. Neck: Inspection unremarkable. No palpable stepoffs. Negative Spurling's maneuver. Full neck range of motion Grip strength and sensation normal  in bilateral hands Strength good C4 to T1 distribution No sensory change to C4 to T1 Negative Hoffman sign bilaterally Reflexes normal  Impression and Recommendations:   This case required medical decision making of moderate complexity.

## 2012-03-19 NOTE — Assessment & Plan Note (Signed)
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 She is a discrete area of tenderness to palpation over the proximal shaft of her fifth metatarsal. She has a history of metatarsal stress fracture. X-ray, postop shoe, continue calcium and vitamin D supplements. Avoid NSAIDs. We'll do this for one month, MRI if no better.

## 2012-03-19 NOTE — Assessment & Plan Note (Signed)
Status post C5-6 ACDF. Current symptoms are now C7. Prednisone, formal physical therapy, gabapentin, tramadol. X-rays. Return in 4 weeks. MRI if no better.

## 2012-03-30 ENCOUNTER — Encounter: Payer: Self-pay | Admitting: Sports Medicine

## 2012-03-31 ENCOUNTER — Ambulatory Visit
Admission: RE | Admit: 2012-03-31 | Discharge: 2012-03-31 | Disposition: A | Discharge status: Home / Self Care | Payer: BC Managed Care – PPO | Source: Ambulatory Visit | Attending: Gynecology | Admitting: Gynecology

## 2012-03-31 DIAGNOSIS — Z1231 Encounter for screening mammogram for malignant neoplasm of breast: Secondary | ICD-10-CM

## 2012-05-21 ENCOUNTER — Other Ambulatory Visit: Payer: Self-pay | Admitting: *Deleted

## 2012-05-21 MED ORDER — METOPROLOL SUCCINATE ER 50 MG PO TB24: 50.0000 mg | ORAL_TABLET | Freq: Every day | ORAL | Status: DC

## 2012-06-18 ENCOUNTER — Ambulatory Visit (INDEPENDENT_AMBULATORY_CARE_PROVIDER_SITE_OTHER): Payer: BC Managed Care – PPO | Admitting: Sports Medicine

## 2012-06-18 ENCOUNTER — Encounter: Payer: Self-pay | Admitting: Sports Medicine

## 2012-06-18 ENCOUNTER — Ambulatory Visit: Payer: BC Managed Care – PPO | Admitting: Sports Medicine

## 2012-06-18 VITALS — BP 105/62 | HR 72 | Wt 128.0 lb

## 2012-06-18 DIAGNOSIS — M19041 Primary osteoarthritis, right hand: Secondary | ICD-10-CM | POA: Insufficient documentation

## 2012-06-18 DIAGNOSIS — M5412 Radiculopathy, cervical region: Secondary | ICD-10-CM

## 2012-06-18 DIAGNOSIS — M79671 Pain in right foot: Secondary | ICD-10-CM

## 2012-06-18 DIAGNOSIS — M19049 Primary osteoarthritis, unspecified hand: Secondary | ICD-10-CM

## 2012-06-18 MED ORDER — GABAPENTIN 100 MG PO CAPS: 100.0000 mg | ORAL_CAPSULE | Freq: Every day | ORAL | Status: DC | PRN

## 2012-06-18 NOTE — Assessment & Plan Note (Signed)
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 Continue Aleve as necessary, if she desires I will place an injection into the joint. 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

## 2012-06-18 NOTE — Assessment & Plan Note (Signed)
This is classical C7. Resolve with conservative measures including prednisone, gabapentin, therapy. I am going to call her in some 100 mg gabapentin tablets as the 300s are too strong.

## 2012-06-18 NOTE — Assessment & Plan Note (Signed)
Right fifth metatarsal stress reaction has healed. Return as needed, can certainly put her in custom orthotics if she desires.

## 2012-06-18 NOTE — Progress Notes (Signed)
  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  Right fifth metatarsal stress reaction: Resolved with conservative measures.  C7 cervical radiculitis: Resolved with conservative measures.  Right hand pain: Pain is localized at the second metacarpophalangeal joint, there is some swelling, pain is localized to the doesn't radiate, moderate.    Past medical history, Surgical history, Family history not pertinant except as noted below, Social history, Allergies, and medications have been entered into the medical record, reviewed, and no changes needed.   Review of Systems: No fevers, chills, night sweats, weight loss, chest pain, or shortness of breath.   Objective:    General: Well Developed, well nourished, and in no acute distress.  Neuro: Alert and oriented x3, extra-ocular muscles intact, sensation grossly intact.  HEENT: Normocephalic, atraumatic, pupils equal round reactive to light, neck supple, no masses, no lymphadenopathy, thyroid nonpalpable.  Skin: Warm and dry, no rashes. Cardiac: Regular rate and rhythm, no murmurs rubs or gallops, no lower extremity edema.  Respiratory: Clear to auscultation bilaterally. Not using accessory muscles, speaking in full sentences. Neck: Inspection unremarkable. No palpable stepoffs. Negative Spurling's maneuver. Full neck range of motion Grip strength and sensation normal in bilateral hands Strength good C4 to T1 distribution No sensory change to C4 to T1 Negative Hoffman sign bilaterally Reflexes normal Right hand: is unremarkable to inspection but is slightly tender to palpation over the metacarpophalangeal joint with palpable osteophytes.  Impression and Recommendations:

## 2012-06-19 ENCOUNTER — Other Ambulatory Visit: Payer: Self-pay | Admitting: Gynecology

## 2012-06-23 ENCOUNTER — Other Ambulatory Visit: Payer: Self-pay | Admitting: Dermatology

## 2012-07-09 ENCOUNTER — Other Ambulatory Visit: Payer: Self-pay | Admitting: Gynecology

## 2012-07-09 DIAGNOSIS — Z780 Asymptomatic menopausal state: Secondary | ICD-10-CM

## 2012-07-21 ENCOUNTER — Ambulatory Visit (INDEPENDENT_AMBULATORY_CARE_PROVIDER_SITE_OTHER): Payer: BC Managed Care – PPO | Admitting: Sports Medicine

## 2012-07-21 ENCOUNTER — Encounter: Payer: Self-pay | Admitting: Sports Medicine

## 2012-07-21 VITALS — BP 112/62 | HR 74 | Wt 128.0 lb

## 2012-07-21 DIAGNOSIS — M5430 Sciatica, unspecified side: Secondary | ICD-10-CM

## 2012-07-21 DIAGNOSIS — M5432 Sciatica, left side: Secondary | ICD-10-CM

## 2012-07-21 DIAGNOSIS — IMO0001 Reserved for inherently not codable concepts without codable children: Secondary | ICD-10-CM

## 2012-07-21 MED ORDER — PREDNISONE 50 MG PO TABS: ORAL_TABLET | ORAL | Status: DC

## 2012-07-21 NOTE — Assessment & Plan Note (Signed)
Lumbar radiculitis. Trigger point injection performed today, prednisone, home exercises. Return in 4 weeks, we can consider MRI for interventional injection planning if no better.

## 2012-07-21 NOTE — Progress Notes (Signed)
  Subjective:    CC: Followup  HPI: I have seen Kathleen Garcia in the past for low back pain and sciatica, we got her pain to resolve with aggressive hip abductor rehabilitation, and piriformis stretching. Unfortunately she had to sit all day recently, and now has increasing pain in the left side of the low back radiating down the left and L5 versus an S1 distribution. It is worse with [place] car rides, flexion, persistent, moderate. She desires conservative treatment.  Past medical history, Surgical history, Family history not pertinant except as noted below, Social history, Allergies, and medications have been entered into the medical record, reviewed, and no changes needed.   Review of Systems: No fevers, chills, night sweats, weight loss, chest pain, or shortness of breath.   Objective:    General: Well Developed, well nourished, and in no acute distress.  Neuro: Alert and oriented x3, extra-ocular muscles intact, sensation grossly intact.  HEENT: Normocephalic, atraumatic, pupils equal round reactive to light, neck supple, no masses, no lymphadenopathy, thyroid nonpalpable.  Skin: Warm and dry, no rashes. Cardiac: Regular rate and rhythm, no murmurs rubs or gallops, no lower extremity edema.  Respiratory: Clear to auscultation bilaterally. Not using accessory muscles, speaking in full sentences. Back Exam:  Inspection: Unremarkable  Motion: Flexion 45 deg, Extension 45 deg, Side Bending to 45 deg bilaterally,  Rotation to 45 deg bilaterally  SLR laying: Negative  XSLR laying: Negative  Palpable tenderness: There is a palpable area of muscle spasm in the left paraspinal muscles near the PSIS.Marland Kitchen FABER: negative. Sensory change: Gross sensation intact to all lumbar and sacral dermatomes.  Reflexes: 2+ at both patellar tendons, 2+ at achilles tendons, Babinski's downgoing.  Strength at foot  Plantar-flexion: 5/5 Dorsi-flexion: 5/5 Eversion: 5/5 Inversion: 5/5  Leg strength  Quad: 5/5 Hamstring:  5/5 Hip flexor: 5/5 Hip abductors: 5/5  Gait unremarkable.  Procedure:  Injection of left PSIS trigger point Consent obtained and verified. Time-out conducted. Noted no overlying erythema, induration, or other signs of local infection. Skin prepped in a sterile fashion. Topical analgesic spray: Ethyl chloride. Completed without difficulty. Meds: 0.5 cc Kenalog 40, 1 cc lidocaine injected in a fanlike pattern. Pain immediately improved suggesting accurate placement of the medication. Advised to call if fevers/chills, erythema, induration, drainage, or persistent bleeding.  Impression and Recommendations:

## 2012-07-28 ENCOUNTER — Other Ambulatory Visit: Payer: BC Managed Care – PPO

## 2012-08-05 ENCOUNTER — Ambulatory Visit
Admission: RE | Admit: 2012-08-05 | Discharge: 2012-08-05 | Disposition: A | Discharge status: Home / Self Care | Payer: BC Managed Care – PPO | Source: Ambulatory Visit | Attending: Gynecology | Admitting: Gynecology

## 2012-08-05 DIAGNOSIS — Z780 Asymptomatic menopausal state: Secondary | ICD-10-CM

## 2012-08-11 ENCOUNTER — Ambulatory Visit (INDEPENDENT_AMBULATORY_CARE_PROVIDER_SITE_OTHER): Payer: BC Managed Care – PPO

## 2012-08-11 ENCOUNTER — Ambulatory Visit (INDEPENDENT_AMBULATORY_CARE_PROVIDER_SITE_OTHER): Payer: BC Managed Care – PPO | Admitting: Sports Medicine

## 2012-08-11 ENCOUNTER — Encounter: Payer: Self-pay | Admitting: Sports Medicine

## 2012-08-11 VITALS — BP 109/66 | HR 78 | Wt 127.0 lb

## 2012-08-11 DIAGNOSIS — M169 Osteoarthritis of hip, unspecified: Secondary | ICD-10-CM

## 2012-08-11 DIAGNOSIS — M5430 Sciatica, unspecified side: Secondary | ICD-10-CM

## 2012-08-11 DIAGNOSIS — M1610 Unilateral primary osteoarthritis, unspecified hip: Secondary | ICD-10-CM

## 2012-08-11 DIAGNOSIS — M5432 Sciatica, left side: Secondary | ICD-10-CM

## 2012-08-11 MED ORDER — CELECOXIB 200 MG PO CAPS: ORAL_CAPSULE | ORAL | Status: DC

## 2012-08-11 NOTE — Assessment & Plan Note (Signed)
Left femoral acetabular joint injection under guidance. Hip flexor rehabilitation. Celebrex. X-rays.

## 2012-08-11 NOTE — Assessment & Plan Note (Addendum)
Continue gabapentin, increase to 300 twice a day, then 303 times a day afterwards. Adding Celebrex. We will hold off on MRI per patient request. I do think she has a lower lumbar disc protrusion causing her symptoms. Return to see me in one month.

## 2012-08-11 NOTE — Progress Notes (Signed)
  Subjective:    CC:  followup  HPI: Low back pain: Radiates down her left leg in an L5 versus an S1 distribution. Worse when sitting in a car, worse with flexion. Worse with Valsalva. She's been to some home exercises and I gave her trigger point injection at the last visit, she noted some improvement with prednisone. She is getting better and does desire to continue conservative measures. She is already taking gabapentin.  Left hip pain: Was getting a massage, and her masseuse seemed to think that her pain was coming from her muscle. She localizes her pain in the left groin, it's painful when standing, and when flexing the hip. Pain is localized but does radiate slightly into the anterior thigh, moderate, persistent. No trauma.  Past medical history, Surgical history, Family history not pertinant except as noted below, Social history, Allergies, and medications have been entered into the medical record, reviewed, and no changes needed.   Review of Systems: No fevers, chills, night sweats, weight loss, chest pain, or shortness of breath.   Objective:    General: Well Developed, well nourished, and in no acute distress.  Neuro: Alert and oriented x3, extra-ocular muscles intact, sensation grossly intact.  HEENT: Normocephalic, atraumatic, pupils equal round reactive to light, neck supple, no masses, no lymphadenopathy, thyroid nonpalpable.  Skin: Warm and dry, no rashes. Cardiac: Regular rate and rhythm, no murmurs rubs or gallops, no lower extremity edema.  Respiratory: Clear to auscultation bilaterally. Not using accessory muscles, speaking in full sentences. Left Hip: ROM IR: 15 Deg, ER: 45 Deg, Flexion: 120 Deg, Extension: 100 Deg, Abduction: 45 Deg, Adduction: 45 Deg Strength IR: 5/5, ER: 5/5, Flexion: 5/5, Extension: 5/5, Abduction: 5/5, Adduction: 5/5 Pelvic alignment unremarkable to inspection and palpation. Standing hip rotation and gait without trendelenburg sign /  unsteadiness. Greater trochanter without tenderness to palpation. No tenderness over piriformis and greater trochanter. No pain with FABER or FADIR. No SI joint tenderness and normal minimal SI movement. Reproduction of pain with internal rotation.  Procedure: Real-time Ultrasound Guided Injection of left femoral acetabular joint Device: GE Logiq E  Verbal informed consent obtained.  Time-out conducted.  Noted no overlying erythema, induration, or other signs of local infection.  Skin prepped in a sterile fashion.  Local anesthesia: Topical Ethyl chloride.  With sterile technique and under real time ultrasound guidance:  Spinal needle advanced to the femoral head/neck junction, 2 cc Kenalog 40, 4 cc lidocaine injected easily. Completed without difficulty  Pain immediately resolved suggesting accurate placement of the medication.  Advised to call if fevers/chills, erythema, induration, drainage, or persistent bleeding.  Images permanently stored and available for review in the ultrasound unit.  Impression: Technically successful ultrasound guided injection.  Impression and Recommendations:

## 2012-08-12 ENCOUNTER — Telehealth: Payer: Self-pay | Admitting: *Deleted

## 2012-08-12 NOTE — Telephone Encounter (Signed)
Prior auth obtained online through [employer] for Celebrex.  Faxed approval to pharmacy. Barry Dienes, LPN

## 2012-09-16 ENCOUNTER — Encounter: Payer: Self-pay | Admitting: Sports Medicine

## 2012-09-16 ENCOUNTER — Ambulatory Visit (INDEPENDENT_AMBULATORY_CARE_PROVIDER_SITE_OTHER): Payer: BC Managed Care – PPO | Admitting: Sports Medicine

## 2012-09-16 VITALS — BP 108/60 | HR 84 | Wt 125.0 lb

## 2012-09-16 DIAGNOSIS — M5432 Sciatica, left side: Secondary | ICD-10-CM

## 2012-09-16 DIAGNOSIS — M1610 Unilateral primary osteoarthritis, unspecified hip: Secondary | ICD-10-CM

## 2012-09-16 DIAGNOSIS — M169 Osteoarthritis of hip, unspecified: Secondary | ICD-10-CM

## 2012-09-16 DIAGNOSIS — M5430 Sciatica, unspecified side: Secondary | ICD-10-CM

## 2012-09-16 NOTE — Assessment & Plan Note (Signed)
Pain continues to improve. Return on an as needed basis for this.

## 2012-09-16 NOTE — Progress Notes (Signed)
  Subjective:    CC: Follow up  HPI: Left hip osteoarthritis: 80 percent improved and continued to improve after femoroacetabular joint injection one month ago.  Discogenic low back pain with radiculitis: Only hurts slightly possibly once per week when she sits for a [place] time, overall extremely happy with results, taking gabapentin 100 mg during the day and 300 at night. Gets groggy with anything greater.  Past medical history, Surgical history, Family history not pertinant except as noted below, Social history, Allergies, and medications have been entered into the medical record, reviewed, and no changes needed.   Review of Systems: No fevers, chills, night sweats, weight loss, chest pain, or shortness of breath.   Objective:    General: Well Developed, well nourished, and in no acute distress.  Neuro: Alert and oriented x3, extra-ocular muscles intact, sensation grossly intact.  HEENT: Normocephalic, atraumatic, pupils equal round reactive to light, neck supple, no masses, no lymphadenopathy, thyroid nonpalpable.  Skin: Warm and dry, no rashes. Cardiac: Regular rate and rhythm, no murmurs rubs or gallops, no lower extremity edema.  Respiratory: Clear to auscultation bilaterally. Not using accessory muscles, speaking in full sentences. Impression and Recommendations:

## 2012-09-16 NOTE — Assessment & Plan Note (Signed)
Likely related to lumbar radiculitis. Pain only occurs briefly once per week when she sits for too [place], she is very happy with gabapentin 100 mg during the day and 300 bedtime. Return as needed.

## 2012-11-06 ENCOUNTER — Other Ambulatory Visit: Payer: Self-pay

## 2013-01-21 ENCOUNTER — Encounter: Payer: Self-pay | Admitting: Cardiovascular Disease

## 2013-01-21 ENCOUNTER — Other Ambulatory Visit: Payer: Self-pay | Admitting: Cardiovascular Disease

## 2013-01-30 ENCOUNTER — Encounter: Payer: Self-pay | Admitting: Cardiovascular Disease

## 2013-01-30 ENCOUNTER — Ambulatory Visit (INDEPENDENT_AMBULATORY_CARE_PROVIDER_SITE_OTHER): Payer: BC Managed Care – PPO | Admitting: Cardiovascular Disease

## 2013-01-30 VITALS — BP 100/64 | HR 73 | Ht 61.0 in | Wt 125.4 lb

## 2013-01-30 DIAGNOSIS — R002 Palpitations: Secondary | ICD-10-CM

## 2013-01-30 MED ORDER — METOPROLOL SUCCINATE ER 50 MG PO TB24: 50.0000 mg | ORAL_TABLET | Freq: Every day | ORAL | Status: DC

## 2013-01-30 NOTE — Patient Instructions (Signed)
Your physician wants you to follow-up in: 1 YEAR.  You will receive a reminder letter in the mail two months in advance. If you don't receive a letter, please call our office to schedule the follow-up appointment.  Your physician recommends that you continue on your current medications as directed. Please refer to the Current Medication list given to you today.  

## 2013-01-30 NOTE — Assessment & Plan Note (Signed)
Kathleen Garcia is doing well. She has occasional palpitations. Overall these are very well tolerated. We'll continue with her current dose of Toprol-XL. I'll see her again in one year.

## 2013-01-30 NOTE — Progress Notes (Signed)
Kathleen Garcia Date of Birth  05/10/1950 [facility] [address]    [address] [place], [place]  [id number] [phone]  Fax  [phone]  Problems: 1. Palpitations  History of Present Illness:  Kathleen Garcia is a 63 y.o. female with a hx of palpitations.  She has been well controlled on Metoprolol.  She has occasional episodes of palpitations that sound like PVCs.  Jan. 30, 2015 Kathleen Garcia is doing very well. She has occasional episodes of palpitations but these are very rare. She walks several times a week at least 2 miles a day to  Current Outpatient Prescriptions on File Prior to Visit  Medication Sig Dispense Refill  . CALCIUM PO Take 1,500 mg by mouth.        . cetirizine (ZYRTEC) 10 MG tablet Take 10 mg by mouth daily.        . Cholecalciferol (VITAMIN D PO) Take 2,000 Units by mouth.       . estradiol-norethindrone (ACTIVELLA) 1-0.5 MG per tablet Take 1 tablet by mouth daily.        Marland Kitchen. gabapentin (NEURONTIN) 300 MG capsule One tab PO qHS for a week, then BID for a week, then TID.  May increase to a max of 4 tabs PO TID.  180 capsule  3  . metoprolol succinate (TOPROL-XL) 50 MG 24 hr tablet Take 1 tablet (50 mg total) by mouth daily. Take with or immediately following a meal.  90 tablet  2  . montelukast (SINGULAIR) 10 MG tablet Take 10 mg by mouth at bedtime.        . ranitidine (ZANTAC) 150 MG capsule Take 150 mg by mouth daily.         No current facility-administered medications on file prior to visit.    Allergies  Allergen Reactions  . Betadine [Povidone Iodine]   . Morphine   . Povidone-Iodine   . Promethazine Hcl     Past Medical History  Diagnosis Date  . Palpitation   . Trigeminal neuralgia   . Chest pain   . Fluttering heart   . Irritable bowel syndrome   . Irregular heart rate   . Osteoporosis   . Diarrhea     The history was provided by the patient  . Vomiting     The history was provided by the patient  . Osteomyelitis     middle phalanx,  right index finger  . Cervical spondylosis      with disk protrusion    Past Surgical History  Procedure Laterality Date  . Knee surgery    . Tonsillectomy    . Cervical disc surgery    . Tubal ligation    . Colonoscopy      with biopsies  . Upper gastrointestinal endoscopy       with biopsies    History  Smoking status  . Never Smoker   Smokeless tobacco  . Not on file    History  Alcohol Use No    Family History  Problem Relation Age of Onset  . Hypertension Mother   . Coronary artery disease Father     Reviw of Systems:  Reviewed in the HPI.  All other systems are negative.  Physical Exam: BP 100/64  Pulse 73  Ht 5\' 1"  (1.549 m)  Wt 125 lb 6.4 oz (56.881 kg)  BMI 23.71 kg/m2 The patient is alert and oriented x 3.  The mood and affect are normal.   Skin: warm and  dry.  Color is normal.    HEENT:   [place]/AT, normal carotids, no JVD  Lungs: clear    Heart: RR, no murmurs    Abdomen: + BS, soft, no HSM  Extremities:  No C/C/E  Neuro:  Non focal    ECG: Generally 30, 2015: Normal sinus rhythm at 73. The EKG is normal.  Assessment / Plan:

## 2013-02-10 ENCOUNTER — Ambulatory Visit (INDEPENDENT_AMBULATORY_CARE_PROVIDER_SITE_OTHER): Payer: BC Managed Care – PPO | Admitting: Sports Medicine

## 2013-02-10 ENCOUNTER — Encounter: Payer: Self-pay | Admitting: Sports Medicine

## 2013-02-10 VITALS — BP 112/65 | HR 93 | Temp 98.7°F | Ht 61.0 in | Wt 121.0 lb

## 2013-02-10 DIAGNOSIS — A088 Other specified intestinal infections: Secondary | ICD-10-CM

## 2013-02-10 DIAGNOSIS — A084 Viral intestinal infection, unspecified: Secondary | ICD-10-CM | POA: Insufficient documentation

## 2013-02-10 MED ORDER — PROCHLORPERAZINE MALEATE 10 MG PO TABS: 10.0000 mg | ORAL_TABLET | Freq: Four times a day (QID) | ORAL | Status: DC | PRN

## 2013-02-10 MED ORDER — ONDANSETRON 8 MG PO TBDP: 8.0000 mg | ORAL_TABLET | Freq: Three times a day (TID) | ORAL | Status: DC | PRN

## 2013-02-10 NOTE — Assessment & Plan Note (Signed)
Symptoms are mild and duration is short. Zofran and Compazine. Return as needed.

## 2013-02-10 NOTE — Patient Instructions (Signed)
Viral Gastroenteritis Viral gastroenteritis is also known as stomach flu. This condition affects the stomach and intestinal tract. It can cause sudden diarrhea and vomiting. The illness typically lasts 3 to 8 days. Most people develop an immune response that eventually gets rid of the virus. While this natural response develops, the virus can make you quite ill. CAUSES  Many different viruses can cause gastroenteritis, such as rotavirus or noroviruses. You can catch one of these viruses by consuming contaminated food or water. You may also catch a virus by sharing utensils or other personal items with an infected person or by touching a contaminated surface. SYMPTOMS  The most common symptoms are diarrhea and vomiting. These problems can cause a severe loss of body fluids (dehydration) and a body salt (electrolyte) imbalance. Other symptoms may include:  Fever.  Headache.  Fatigue.  Abdominal pain. DIAGNOSIS  Your caregiver can usually diagnose viral gastroenteritis based on your symptoms and a physical exam. A stool sample may also be taken to test for the presence of viruses or other infections. TREATMENT  This illness typically goes away on its own. Treatments are aimed at rehydration. The most serious cases of viral gastroenteritis involve vomiting so severely that you are not able to keep fluids down. In these cases, fluids must be given through an intravenous line (IV). HOME CARE INSTRUCTIONS   Drink enough fluids to keep your urine clear or pale yellow. Drink small amounts of fluids frequently and increase the amounts as tolerated.  Ask your caregiver for specific rehydration instructions.  Avoid:  Foods high in sugar.  Alcohol.  Carbonated drinks.  Tobacco.  Juice.  Caffeine drinks.  Extremely hot or cold fluids.  Fatty, greasy foods.  Too much intake of anything at one time.  Dairy products until 24 to 48 hours after diarrhea stops.  You may consume probiotics.  Probiotics are active cultures of beneficial bacteria. They may lessen the amount and number of diarrheal stools in adults. Probiotics can be found in yogurt with active cultures and in supplements.  Wash your hands well to avoid spreading the virus.  Only take over-the-counter or prescription medicines for pain, discomfort, or fever as directed by your caregiver. Do not give aspirin to children. Antidiarrheal medicines are not recommended.  Ask your caregiver if you should continue to take your regular prescribed and over-the-counter medicines.  Keep all follow-up appointments as directed by your caregiver. SEEK IMMEDIATE MEDICAL CARE IF:   You are unable to keep fluids down.  You do not urinate at least once every 6 to 8 hours.  You develop shortness of breath.  You notice blood in your stool or vomit. This may look like coffee grounds.  You have abdominal pain that increases or is concentrated in one small area (localized).  You have persistent vomiting or diarrhea.  You have a fever.  The patient is a child younger than 3 months, and he or she has a fever.  The patient is a child older than 3 months, and he or she has a fever and persistent symptoms.  The patient is a child older than 3 months, and he or she has a fever and symptoms suddenly get worse.  The patient is a baby, and he or she has no tears when crying. MAKE SURE YOU:   Understand these instructions.  Will watch your condition.  Will get help right away if you are not doing well or get worse. Document Released: 12/18/2004 Document Revised: 03/12/2011 Document Reviewed: 10/04/2010   Exit[employer] Patient Information 2014 [employer].  

## 2013-02-10 NOTE — Progress Notes (Signed)
  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 She has only mild left lower quadrant abdominal pain which is on and off. No skin rashes, no upper respiratory symptoms, low-grade fevers and chills. She is able to keep down fluids and foods, and feels as though she's improving slightly. She did take some Compazine at home which was effective. Symptoms are moderate, improving.  Past medical history, Surgical history, Family history not pertinant except as noted below, Social history, Allergies, and medications have been entered into the medical record, reviewed, and no changes needed.   Review of Systems: No fevers, chills, night sweats, weight loss, chest pain, or shortness of breath.   Objective:    General: Well Developed, well nourished, and in no acute distress.  Neuro: Alert and oriented x3, extra-ocular muscles intact, sensation grossly intact.  HEENT: Normocephalic, atraumatic, pupils equal round reactive to light, neck supple, no masses, no lymphadenopathy, thyroid nonpalpable.  Skin: Warm and dry, no rashes. Cardiac: Regular rate and rhythm, no murmurs rubs or gallops, no lower extremity edema.  Respiratory: Clear to auscultation bilaterally. Not using accessory muscles, speaking in full sentences. Abdomen: Soft, minimally tender to palpation in the left lower quadrant, nondistended, normal bowel sounds, no palpable masses, no guarding, no rigidity, no rebound tenderness.  Impression and Recommendations:

## 2013-03-06 ENCOUNTER — Other Ambulatory Visit: Payer: Self-pay

## 2013-03-06 DIAGNOSIS — Z1231 Encounter for screening mammogram for malignant neoplasm of breast: Secondary | ICD-10-CM

## 2013-03-16 ENCOUNTER — Telehealth: Payer: Self-pay | Admitting: Sports Medicine

## 2013-03-16 MED ORDER — HYOSCYAMINE SULFATE 0.125 MG PO TABS: 0.1250 mg | ORAL_TABLET | ORAL | Status: DC | PRN

## 2013-03-16 NOTE — Telephone Encounter (Signed)
Pt called. She has IBS flare up and wants to know if doctor will prescribe Levsin(given to her in the past by another doc) this med ususally helps to calm things down.  If prescribed please call script in to [facility].  Thank you.

## 2013-03-16 NOTE — Telephone Encounter (Signed)
Done

## 2013-03-16 NOTE — Telephone Encounter (Signed)
Patient has been informed. Forever Arechiga,CMA  

## 2013-03-16 NOTE — Telephone Encounter (Signed)
Please see note below. Kathleen Garcia,CMA  

## 2013-03-25 ENCOUNTER — Other Ambulatory Visit: Payer: Self-pay | Admitting: Sports Medicine

## 2013-04-06 ENCOUNTER — Ambulatory Visit
Admission: RE | Admit: 2013-04-06 | Discharge: 2013-04-06 | Disposition: A | Discharge status: Home / Self Care | Payer: BC Managed Care – PPO | Source: Ambulatory Visit

## 2013-04-06 DIAGNOSIS — Z1231 Encounter for screening mammogram for malignant neoplasm of breast: Secondary | ICD-10-CM

## 2013-04-13 ENCOUNTER — Telehealth: Payer: Self-pay | Admitting: Cardiovascular Disease

## 2013-04-13 NOTE — Telephone Encounter (Signed)
New problem   Pt has been having palpitations and need to speak to nurse about it. Please call pt.

## 2013-04-13 NOTE — Telephone Encounter (Signed)
C/O having nausea and vomiting 2 weeks ago, since then she has been fatigued, slight SOB with exertion but NONE noted in speech but c/o more palpitations. Pt has not been able to eat much/ no appetite, pt avoids caffeine. Has only taken bp once, been on metoprolol for years for palpitations// pt takes metoprolol in evening. bp 104/70 p 60-72 one HR was 104 bpm Pt has felt about 10 palpitations this morning. No availability on extenders schedule this week/ Dr Elease HashimotoNahser has no availability this week, I offered and app in [place] with him today but declined. Pt was told that I will ask Dr Elease HashimotoNahser to make recommendation/ I told her to call PCP and see if they could see. Also asked her to get bp/p daily. Please advise

## 2013-04-13 NOTE — Telephone Encounter (Signed)
Pt was informed and verbalized understanding.

## 2013-04-13 NOTE — Telephone Encounter (Signed)
Her nausea and vomitting is likely the whole reason behind her palpitations and her tachycardia and low BP.  She needs to eat chicken noodle soup.  Drink Gatorade, . She needs to add salt to what ever she can until she is feeling better.   Taking metoprolol will be difficutl - I suspect it will cause hypotension with minimal improvement of her HR.   If this has really been going on for 2 weeks, she should see her medical doctor.    The palpitations will not likely go away until this nausea and vomitting resolves.

## 2013-04-14 ENCOUNTER — Ambulatory Visit (INDEPENDENT_AMBULATORY_CARE_PROVIDER_SITE_OTHER): Payer: BC Managed Care – PPO | Admitting: Family Medicine

## 2013-04-14 ENCOUNTER — Encounter: Payer: Self-pay | Admitting: Family Medicine

## 2013-04-14 ENCOUNTER — Ambulatory Visit (INDEPENDENT_AMBULATORY_CARE_PROVIDER_SITE_OTHER): Payer: BC Managed Care – PPO

## 2013-04-14 VITALS — BP 110/62 | HR 106 | Ht 61.0 in | Wt 122.0 lb

## 2013-04-14 DIAGNOSIS — R5381 Other malaise: Secondary | ICD-10-CM

## 2013-04-14 DIAGNOSIS — R002 Palpitations: Secondary | ICD-10-CM

## 2013-04-14 DIAGNOSIS — R5383 Other fatigue: Secondary | ICD-10-CM

## 2013-04-14 DIAGNOSIS — R432 Parageusia: Secondary | ICD-10-CM

## 2013-04-14 DIAGNOSIS — R0602 Shortness of breath: Secondary | ICD-10-CM

## 2013-04-14 DIAGNOSIS — R439 Unspecified disturbances of smell and taste: Secondary | ICD-10-CM

## 2013-04-14 DIAGNOSIS — R509 Fever, unspecified: Secondary | ICD-10-CM

## 2013-04-14 LAB — COMPLETE METABOLIC PANEL WITH GFR
ALT: 9 U/L (ref 0–35)
AST: 18 U/L (ref 0–37)
Albumin: 4.2 g/dL (ref 3.5–5.2)
Alkaline Phosphatase: 60 U/L (ref 39–117)
BILIRUBIN TOTAL: 0.3 mg/dL (ref 0.2–1.2)
BUN: 8 mg/dL (ref 6–23)
CO2: 30 meq/L (ref 19–32)
Calcium: 9.5 mg/dL (ref 8.4–10.5)
Chloride: 100 mEq/L (ref 96–112)
Creat: 0.67 mg/dL (ref 0.50–1.10)
GLUCOSE: 83 mg/dL (ref 70–99)
Potassium: 3.8 mEq/L (ref 3.5–5.3)
Sodium: 139 mEq/L (ref 135–145)
Total Protein: 7.3 g/dL (ref 6.0–8.3)

## 2013-04-14 LAB — CBC WITH DIFFERENTIAL/PLATELET
Basophils Absolute: 0 10*3/uL (ref 0.0–0.1)
Basophils Relative: 0 % (ref 0–1)
EOS ABS: 0 10*3/uL (ref 0.0–0.7)
Eosinophils Relative: 0 % (ref 0–5)
HCT: 35.4 % — ABNORMAL LOW (ref 36.0–46.0)
HEMOGLOBIN: 12.6 g/dL (ref 12.0–15.0)
Lymphocytes Relative: 26 % (ref 12–46)
Lymphs Abs: 1.9 10*3/uL (ref 0.7–4.0)
MCH: 30.4 pg (ref 26.0–34.0)
MCHC: 35.6 g/dL (ref 30.0–36.0)
MCV: 85.3 fL (ref 78.0–100.0)
MONO ABS: 0.7 10*3/uL (ref 0.1–1.0)
MONOS PCT: 9 % (ref 3–12)
NEUTROS PCT: 65 % (ref 43–77)
Neutro Abs: 4.8 10*3/uL (ref 1.7–7.7)
Platelets: 266 10*3/uL (ref 150–400)
RBC: 4.15 MIL/uL (ref 3.87–5.11)
RDW: 13.5 % (ref 11.5–15.5)
WBC: 7.4 10*3/uL (ref 4.0–10.5)

## 2013-04-14 NOTE — Progress Notes (Signed)
Subjective:    Patient ID: Kathleen Garcia, female    DOB: 06/15/1950, 63 y.o.   MRN: [id number]  HPI  Hx of palpitations but really well controlled on metoprol.  Had a good check up with cards in Feb. .  She ws feeling fatigued and BP was low. Her cardiologist stopped her metoprolol 2 months ago. Dec appetite. Still some nausea lingering from a stomach bug from last night.  Very fatigued for about 1 week. Feels better sitting and lying. Worse with moving around. No caffine or decongestants.  Low grade temp around 5-6 PM each night x 1 week.  No ST or URI sxs.  No GI symptoms. No blood in the stool. No worsening or alleviating factors. Was told to eat more salty foods the next couple days to help raise her blood pressure and she has been trying to do that.  Review of Systems BP 110/62  Pulse 106  Ht 5\' 1"  (1.549 m)  Wt 122 lb (55.339 kg)  BMI 23.06 kg/m2  SpO2 96%    Allergies  Allergen Reactions  . Betadine [Povidone Iodine]   . Morphine   . Povidone-Iodine   . Promethazine Hcl     Past Medical History  Diagnosis Date  . Palpitation   . Trigeminal neuralgia   . Chest pain   . Fluttering heart   . Irritable bowel syndrome   . Irregular heart rate   . Osteoporosis   . Diarrhea     The history was provided by the patient  . Vomiting     The history was provided by the patient  . Osteomyelitis     middle phalanx, right index finger  . Cervical spondylosis      with disk protrusion    Past Surgical History  Procedure Laterality Date  . Knee surgery    . Tonsillectomy    . Cervical disc surgery    . Tubal ligation    . Colonoscopy      with biopsies  . Upper gastrointestinal endoscopy       with biopsies    History   Social History  . Marital Status: Married    Spouse Name: N/A    Number of Children: N/A  . Years of Education: N/A   Occupational History  . Not on file.   Social History Main Topics  . Smoking status: Never Smoker   . Smokeless tobacco: Not  on file  . Alcohol Use: No  . Drug Use: Not on file  . Sexual Activity: Not on file   Other Topics Concern  . Not on file   Social History Narrative  . No narrative on file    Family History  Problem Relation Age of Onset  . Hypertension Mother   . Coronary artery disease Father     Outpatient Encounter Prescriptions as of 04/14/2013  Medication Sig  . CALCIUM PO Take 1,500 mg by mouth.    . cetirizine (ZYRTEC) 10 MG tablet Take 10 mg by mouth daily.    . Cholecalciferol (VITAMIN D PO) Take 2,000 Units by mouth.   . estradiol-norethindrone (ACTIVELLA) 1-0.5 MG per tablet Take 1 tablet by mouth daily.    Marland Kitchen. gabapentin (NEURONTIN) 300 MG capsule 1 AT BED FOR 7 DAYS, TWICE A DAY FOR 7 DAYS, 3 TIMES A DAY, MAY INCREASE TO 4 CAPS 3 TIMES A DAY  . hyoscyamine (LEVSIN, ANASPAZ) 0.125 MG tablet Take 1 tablet (0.125 mg total) by mouth every 4 (  four) hours as needed for cramping.  . metoprolol succinate (TOPROL-XL) 50 MG 24 hr tablet Take 1 tablet (50 mg total) by mouth daily. Take with or immediately following a meal.  . montelukast (SINGULAIR) 10 MG tablet Take 10 mg by mouth at bedtime.    . ondansetron (ZOFRAN-ODT) 8 MG disintegrating tablet Take 1 tablet (8 mg total) by mouth every 8 (eight) hours as needed for nausea.  . prochlorperazine (COMPAZINE) 10 MG tablet Take 1 tablet (10 mg total) by mouth every 6 (six) hours as needed for nausea or vomiting.  . ranitidine (ZANTAC) 150 MG capsule Take 150 mg by mouth daily.            Objective:   Physical Exam  Constitutional: She is oriented to person, place, and time. She appears well-developed and well-nourished.  HENT:  Head: Normocephalic and atraumatic.  Right Ear: External ear normal.  Left Ear: External ear normal.  Nose: Nose normal.  Mouth/Throat: Oropharynx is clear and moist.  TMs and canals are clear.   Eyes: Conjunctivae and EOM are normal. Pupils are equal, round, and reactive to light.  Neck: Neck supple. No  thyromegaly present.  Cardiovascular: Normal rate, regular rhythm and normal heart sounds.   Pulmonary/Chest: Effort normal and breath sounds normal. She has no wheezes.  Abdominal: Soft. Bowel sounds are normal. She exhibits no distension and no mass. There is no tenderness. There is no rebound and no guarding.  Lymphadenopathy:    She has no cervical adenopathy.  Neurological: She is alert and oriented to person, place, and time.  Skin: Skin is warm and dry.  Psychiatric: She has a normal mood and affect.          Assessment & Plan:  Fatigue - Extreme. Will check CBC, TSH, and electrolytes. She's been hydrating well but has very decreased appetite.  Palpitations - EKG shows rate of 88 beats per minute, normal sinus rhythm. Inverted T wave in lead 3. And in lead V3. Otherwise no acute ST-T wave changes. Normal axis.  SOB - that is fairly new and started today. Slightly decreased pulse oximeter 96%. Recommended a chest x-ray. Also will test her D. dimer. EKG was normal which is reassuring. Also rule out infectious causes.

## 2013-04-15 ENCOUNTER — Telehealth: Payer: Self-pay | Admitting: *Deleted

## 2013-04-15 ENCOUNTER — Encounter: Payer: Self-pay | Admitting: Family Medicine

## 2013-04-15 ENCOUNTER — Ambulatory Visit (INDEPENDENT_AMBULATORY_CARE_PROVIDER_SITE_OTHER): Payer: BC Managed Care – PPO

## 2013-04-15 ENCOUNTER — Encounter: Payer: Self-pay | Admitting: *Deleted

## 2013-04-15 ENCOUNTER — Ambulatory Visit (INDEPENDENT_AMBULATORY_CARE_PROVIDER_SITE_OTHER): Payer: BC Managed Care – PPO | Admitting: Family Medicine

## 2013-04-15 ENCOUNTER — Other Ambulatory Visit: Payer: Self-pay | Admitting: Family Medicine

## 2013-04-15 VITALS — BP 118/72 | HR 108 | Temp 98.0°F | Resp 17

## 2013-04-15 DIAGNOSIS — J984 Other disorders of lung: Secondary | ICD-10-CM

## 2013-04-15 DIAGNOSIS — R079 Chest pain, unspecified: Secondary | ICD-10-CM

## 2013-04-15 DIAGNOSIS — K7689 Other specified diseases of liver: Secondary | ICD-10-CM

## 2013-04-15 DIAGNOSIS — R0602 Shortness of breath: Secondary | ICD-10-CM

## 2013-04-15 DIAGNOSIS — J189 Pneumonia, unspecified organism: Secondary | ICD-10-CM

## 2013-04-15 DIAGNOSIS — I2699 Other pulmonary embolism without acute cor pulmonale: Secondary | ICD-10-CM

## 2013-04-15 DIAGNOSIS — R7989 Other specified abnormal findings of blood chemistry: Secondary | ICD-10-CM

## 2013-04-15 LAB — EPSTEIN-BARR VIRUS VCA, IGG: EBV VCA IGG: 729 U/mL — AB (ref <18.0)

## 2013-04-15 LAB — FOLATE: FOLATE: 16.7 ng/mL

## 2013-04-15 LAB — VITAMIN B12: Vitamin B-12: 662 pg/mL (ref 211–911)

## 2013-04-15 LAB — D-DIMER, QUANTITATIVE: D-Dimer, Quant: 3.1 ug/mL-FEU — ABNORMAL HIGH (ref 0.00–0.48)

## 2013-04-15 LAB — EPSTEIN-BARR VIRUS VCA, IGM: EBV VCA IGM: 16.8 U/mL (ref <36.0)

## 2013-04-15 LAB — TSH: TSH: 0.856 u[IU]/mL (ref 0.350–4.500)

## 2013-04-15 LAB — FERRITIN: FERRITIN: 73 ng/mL (ref 10–291)

## 2013-04-15 MED ORDER — PENICILLIN G BENZATHINE 1200000 UNIT/2ML IM SUSP
1.2000 10*6.[IU] | Freq: Once | INTRAMUSCULAR | Status: AC
  Administered 2013-04-15: 1.2 10*6.[IU] via INTRAMUSCULAR

## 2013-04-15 MED ORDER — IOHEXOL 350 MG/ML SOLN
100.0000 mL | Freq: Once | INTRAVENOUS | Status: AC | PRN
  Administered 2013-04-15: 100 mL via INTRAVENOUS

## 2013-04-15 NOTE — Addendum Note (Signed)
Addended by: Deno EtienneBARKLEY, Jaryah Aracena L on: 04/15/2013 02:10 PM   Modules accepted: Orders

## 2013-04-15 NOTE — Telephone Encounter (Signed)
Faxed notes to dr.[email or web address].Deno Etienneonya L Saumya Hukill

## 2013-04-15 NOTE — Telephone Encounter (Signed)
PA obtained for CT Chest w/contrast. Auth # [id number].  Meyer CoryMisty Jeidy Hoerner, LPN  Pt scheduled for 12pm today. Pt notified.  Meyer CoryMisty Bricyn Labrada, LPN

## 2013-04-15 NOTE — Patient Instructions (Signed)
Pulmonary Embolus A pulmonary (lung) embolus (PE) is a blood clot that has traveled from another place in the body to the lung. Most clots come from deep veins in the legs or pelvis. PE is a dangerous and potentially life-threatening condition that can be treated if identified. CAUSES Blood clots form in a vein for different reasons. Usually several things cause blood clots. They include:  The flow of blood slows down.  The inside of the vein is damaged in some way.  The person has a condition that makes the blood clot more easily. These conditions may include:  Older age (especially over 75 years old).  Having a history of blood clots.  Having major or lengthy surgery. Hip surgery is particularly high-risk.  Breaking a hip or leg.  Sitting or lying still for a [place] time.  Cancer or cancer treatment.  Having a [place], thin tube (catheter) placed inside a vein during a medical procedure.  Being overweight (obese).  Pregnancy and childbirth.  Medicines with estrogen.  Smoking.  Other circulation or heart problems. SYMPTOMS  The symptoms of a PE usually start suddenly and include:  Shortness of breath.  Coughing.  Coughing up blood or blood-tinged mucus (phlegm).  Chest pain. Pain is often worse with deep breaths.  Rapid heartbeat. DIAGNOSIS  If a PE is suspected, your caregiver will take a medical history and carry out a physical exam. Your caregiver will check for the risk factors listed above. Tests that also may be required include:  Blood tests, including studies of the clotting properties of your blood.  Imaging tests. Ultrasound, CT, MRI, and other tests can all be used to see if you have clots in your legs or lungs. If you have a clot in your legs and have breathing or chest problems, your caregiver may conclude that you have a clot in your lungs. Further lung tests may not be needed.  Electrocardiography can look for heart strain from blood clots in the  lungs. PREVENTION   Exercise the legs regularly. Take a brisk 30 minute walk every day.  Maintain a weight that is appropriate for your height.  Avoid sitting or lying in bed for [place] periods of time without moving your legs.  Women, particularly those over the age of 35, should consider the risks and benefits of taking estrogen medicines, including birth control pills.  Do not smoke, especially if you take estrogen medicines.  Debarr-distance travel can increase your risk. You should exercise your legs by walking or pumping the muscles every hour.  In hospital prevention:  Your caregiver will assess your need for preventive PE care (prophylaxis) when you are admitted to the hospital. If you are having surgery, your surgeon will assess you the day of or day after surgery.  Prevention may include medical and nonmedical measures. TREATMENT   The most common treatment for a PE is blood thinning (anticoagulant) medicine, which reduces the blood's tendency to clot. Anticoagulants can stop new blood clots from forming and old ones from growing. They cannot dissolve existing clots. Your body does this by itself over time. Anticoagulants can be given by mouth, by intravenous (IV) access, or by injection. Your caregiver will determine the best program for you.  Less commonly, clot-dissolving drugs (thrombolytics) are used to dissolve a PE. They carry a high risk of bleeding, so they are used mainly in severe cases.  Very rarely, a blood clot in the leg needs to be removed surgically.  If you are unable to   take anticoagulants, your caregiver may arrange for you to have a filter placed in a main vein in your abdomen. This filter prevents clots from traveling to your lungs. HOME CARE INSTRUCTIONS   Take all medicines prescribed by your caregiver. Follow the directions carefully.  Warfarin. Most people will continue taking warfarin after hospital discharge. Your caregiver will advise you on the  length of treatment (usually 3 6 months, sometimes lifelong).  Too much and too little warfarin are both dangerous. Too much warfarin increases the risk of bleeding. Too little warfarin continues to allow the risk for blood clots. While taking warfarin, you will need to have regular blood tests to measure your blood clotting time. These blood tests usually include both the prothrombin time (PT) and International Normalized Ratio (INR) tests. The PT and INR results allow your caregiver to adjust your dose of warfarin. The dose can change for many reasons. It is critically important that you take warfarin exactly as prescribed, and that you have your PT and INR levels drawn exactly as directed.  Many foods, especially foods high in vitamin K can interfere with warfarin and affect the PT and INR results. Foods high in vitamin K include spinach, kale, broccoli, cabbage, collard and turnip greens, brussels sprouts, peas, cauliflower, seaweed, and parsley as well as beef and pork liver, green tea, and soybean oil. You should eat a consistent amount of foods high in vitamin K. Avoid major changes in your diet, or notify your caregiver before changing your diet. Arrange a visit with a dietitian to answer your questions.  Many medicines can interfere with warfarin and affect the PT and INR results. You must tell your caregiver about any and all medicines you take, this includes all vitamins and supplements. Be especially cautious with aspirin and anti-inflammatory medicines. Ask your caregiver before taking these. Do not take or discontinue any prescribed or over-the-counter medicine except on the advice of your caregiver or pharmacist.  Warfarin can have side effects, such as excessive bruising or bleeding. You will need to hold pressure over cuts for longer than usual.  Alcohol can change the body's ability to handle warfarin. It is best to avoid alcoholic drinks or consume only very small amounts while taking  warfarin. Notify your caregiver if you change your alcohol intake.  Notify your dentist or other caregivers before procedures.  Avoid contact sports.  Wear a medical alert bracelet or carry a medical alert card.  Ask your caregiver how soon you can go back to normal activities. Not being active can lead to new clots. Ask for a list of what you should and should not do.  Compression stockings. These are tight elastic stockings that apply pressure to the lower legs. This can help keep the blood in the legs from clotting. You may need to wear compressions stockings at home to help prevent clots.  Smoking. If you smoke, quit. Ask your caregiver for help with quitting smoking.  Learn as much as you can about PE. Educating yourself can help prevent PE from reoccurring. SEEK MEDICAL CARE IF:   You notice a rapid heartbeat.  You feel weaker or more tired than usual.  You feel faint.  You notice increased bruising.  Your symptoms are not getting better in the time expected.  You are having side effects of medicine. SEEK IMMEDIATE MEDICAL CARE IF:   You have chest pain.  You have trouble breathing.  You have new or increased swelling or pain in one leg.  You   cough up blood.  You notice blood in vomit, in a bowel movement, or in urine.  You have an oral temperature above 102 F (38.9 C), not controlled by medicine. You may have another PE. A blood clot in the lungs is a medical emergency. Call your local emergency services (911 in [place] to get to the nearest hospital or clinic. Do not drive yourself. MAKE SURE YOU:   Understand these instructions.  Will watch your condition.  Will get help right away if you are not doing well or get worse. Document Released: 12/16/1999 Document Revised: 06/19/2011 Document Reviewed: 06/21/2008 [employer] Patient Information 2014 [employer].  

## 2013-04-15 NOTE — Progress Notes (Addendum)
Subjective:    Patient ID: Kathleen Garcia, female    DOB: 27-Apr-1950, 63 y.o.   MRN: [id number]  HPI Here today for acute pulmonary embolism. She was seen yesterday for her shortness of breath fatigue and low-grade fever. I did order a d-dimer which was elevated at 3. We got her in for a stat CT scan this morning. The results were just finalized she has multiple bilateral pulmonary emboli. Fortunately they are small. She is currently on hormonal therapy. No prior history of cardiac or pulmonary disease. She's never been a smoker. She's never had a prior blood clot including a pulmonary embolus or DVT. She's not had any recent travel or extended periods of sitting in the car etc.  Her daughter is here with her for the discussion today. She said she did have a little bit of chest pain when she was lying flat on the table for the CT scan but otherwise has not had any chest discomfort. No diaphoresis. No lightheadedness or dizziness. She does still feel short of breath with activity. Review of Systems  BP 118/72  Pulse 108  Temp(Src) 98 F (36.7 C)  Resp 17  SpO2 97%    Allergies  Allergen Reactions  . Azithromycin Nausea And Vomiting  . Betadine [Povidone Iodine]   . Morphine   . Povidone-Iodine   . Promethazine Hcl     Past Medical History  Diagnosis Date  . Palpitation   . Trigeminal neuralgia   . Chest pain   . Fluttering heart   . Irritable bowel syndrome   . Irregular heart rate   . Osteoporosis   . Diarrhea     The history was provided by the patient  . Vomiting     The history was provided by the patient  . Osteomyelitis     middle phalanx, right index finger  . Cervical spondylosis      with disk protrusion    Past Surgical History  Procedure Laterality Date  . Knee surgery    . Tonsillectomy    . Cervical disc surgery    . Tubal ligation    . Colonoscopy      with biopsies  . Upper gastrointestinal endoscopy       with biopsies    History   Social  History  . Marital Status: Married    Spouse Name: N/A    Number of Children: N/A  . Years of Education: N/A   Occupational History  . Not on file.   Social History Main Topics  . Smoking status: Never Smoker   . Smokeless tobacco: Not on file  . Alcohol Use: No  . Drug Use: Not on file  . Sexual Activity: Not on file   Other Topics Concern  . Not on file   Social History Narrative  . No narrative on file    Family History  Problem Relation Age of Onset  . Hypertension Mother   . Coronary artery disease Father     Outpatient Encounter Prescriptions as of 04/15/2013  Medication Sig  . CALCIUM PO Take 1,500 mg by mouth.    . cetirizine (ZYRTEC) 10 MG tablet Take 10 mg by mouth daily.    . Cholecalciferol (VITAMIN D PO) Take 2,000 Units by mouth.   . estradiol-norethindrone (ACTIVELLA) 1-0.5 MG per tablet Take 1 tablet by mouth daily.    Marland Kitchen gabapentin (NEURONTIN) 300 MG capsule 1 AT BED FOR 7 DAYS, TWICE A DAY FOR 7 DAYS, 3  TIMES A DAY, MAY INCREASE TO 4 CAPS 3 TIMES A DAY  . hyoscyamine (LEVSIN, ANASPAZ) 0.125 MG tablet Take 1 tablet (0.125 mg total) by mouth every 4 (four) hours as needed for cramping.  . metoprolol succinate (TOPROL-XL) 50 MG 24 hr tablet Take 1 tablet (50 mg total) by mouth daily. Take with or immediately following a meal.  . montelukast (SINGULAIR) 10 MG tablet Take 10 mg by mouth at bedtime.    . ondansetron (ZOFRAN-ODT) 8 MG disintegrating tablet Take 1 tablet (8 mg total) by mouth every 8 (eight) hours as needed for nausea.  . prochlorperazine (COMPAZINE) 10 MG tablet Take 1 tablet (10 mg total) by mouth every 6 (six) hours as needed for nausea or vomiting.  . ranitidine (ZANTAC) 150 MG capsule Take 150 mg by mouth daily.            Objective:   Physical Exam  Constitutional: She is oriented to person, place, and time. She appears well-developed and well-nourished.  HENT:  Head: Normocephalic and atraumatic.  Cardiovascular: Normal rate,  regular rhythm and normal heart sounds.   Mild tachycardia  Pulmonary/Chest: Effort normal and breath sounds normal.  Neurological: She is alert and oriented to person, place, and time.  Skin: Skin is warm and dry.  Psychiatric: She has a normal mood and affect. Her behavior is normal.          Assessment & Plan:  Bilateral pulmonary embolism, multiple-her pulmonary embolism severity index score is 10.  This puts her in class I, low risk.  We discussed options for treatment at that outpatient versus inpatient based on her score risk assessment. We also discussed the risks of outpatient therapy and that if she feels like she gets worse at any point time she scattered emergency department immediately. We discussed the option is a relative versus using Lovenox and Coumadin. We discussed the pros and cons of each of these therapies and that the Xarelto is not reversible. She will followup in one to 2 days to recheck vital signs make sure that she is feeling okay and that she is still stable. Given a starter kit for Xarelto. We'll start with 50 mg twice a day for 21 days and then increase to 20 mg once a day. Most likely cuase is her HRT. Will stop hormones immediately. We reviewed that she go home, not go to work, rest and stay well-hydrated. 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 Her husband will be there so she will not be alone in case something happens and he needs to call 911.  There also some patchy infiltrate at the bases bilaterally. Unsure if this is related to the clots or actually early infection. She has been having some low-grade temperatures in the evenings for the last week will give her a penicillin shot. I would prefer to put her on antibiotic such as azithromycin she says she gets severely nauseated and starts vomiting and has an intolerance to multiple antibiotics.  She also has some emphysematous changes at the bases of both lungs. She's never been a smoker. Secondhand  smoke exposure was minimal. We can address this later with spirometry once she has improved.  Time spent 50 min, counseling about dx, tx, and options for billat pulm embolism.

## 2013-04-16 ENCOUNTER — Telehealth: Payer: Self-pay | Admitting: *Deleted

## 2013-04-16 ENCOUNTER — Observation Stay (HOSPITAL_BASED_OUTPATIENT_CLINIC_OR_DEPARTMENT_OTHER)
Admission: EM | Admit: 2013-04-16 | Discharge: 2013-04-18 | Disposition: A | Discharge status: Home / Self Care | Payer: BC Managed Care – PPO | Attending: Family Medicine | Admitting: Family Medicine

## 2013-04-16 ENCOUNTER — Ambulatory Visit: Payer: BC Managed Care – PPO | Admitting: Family Medicine

## 2013-04-16 ENCOUNTER — Encounter (HOSPITAL_BASED_OUTPATIENT_CLINIC_OR_DEPARTMENT_OTHER): Payer: Self-pay | Admitting: Emergency Medicine

## 2013-04-16 DIAGNOSIS — I2699 Other pulmonary embolism without acute cor pulmonale: Secondary | ICD-10-CM

## 2013-04-16 DIAGNOSIS — T45515A Adverse effect of anticoagulants, initial encounter: Secondary | ICD-10-CM | POA: Insufficient documentation

## 2013-04-16 DIAGNOSIS — R55 Syncope and collapse: Secondary | ICD-10-CM | POA: Diagnosis present

## 2013-04-16 DIAGNOSIS — R110 Nausea: Principal | ICD-10-CM

## 2013-04-16 DIAGNOSIS — Z79899 Other long term (current) drug therapy: Secondary | ICD-10-CM | POA: Insufficient documentation

## 2013-04-16 DIAGNOSIS — M810 Age-related osteoporosis without current pathological fracture: Secondary | ICD-10-CM | POA: Insufficient documentation

## 2013-04-16 LAB — BASIC METABOLIC PANEL
BUN: 5 mg/dL — ABNORMAL LOW (ref 6–23)
CHLORIDE: 100 meq/L (ref 96–112)
CO2: 25 mEq/L (ref 19–32)
Calcium: 9.5 mg/dL (ref 8.4–10.5)
Creatinine, Ser: 0.7 mg/dL (ref 0.50–1.10)
GFR calc Af Amer: >90 mL/min (ref >90)
GFR calc non Af Amer: >90 mL/min (ref >90)
GLUCOSE: 106 mg/dL — AB (ref 70–99)
POTASSIUM: 3.7 meq/L (ref 3.7–5.3)
Sodium: 140 mEq/L (ref 137–147)

## 2013-04-16 LAB — CBC
HEMATOCRIT: 39.5 % (ref 36.0–46.0)
HEMOGLOBIN: 13.5 g/dL (ref 12.0–15.0)
MCH: 31.2 pg (ref 26.0–34.0)
MCHC: 34.2 g/dL (ref 30.0–36.0)
MCV: 91.2 fL (ref 78.0–100.0)
Platelets: 268 10*3/uL (ref 150–400)
RBC: 4.33 MIL/uL (ref 3.87–5.11)
RDW: 12.6 % (ref 11.5–15.5)
WBC: 8.3 10*3/uL (ref 4.0–10.5)

## 2013-04-16 LAB — TROPONIN I: Troponin I: <0.3 ng/mL (ref <0.30)

## 2013-04-16 LAB — CMV IGM: CMV IgM: 9.21 AU/mL (ref <30.00)

## 2013-04-16 MED ORDER — HYOSCYAMINE SULFATE 0.125 MG PO TABS
0.1250 mg | ORAL_TABLET | ORAL | Status: DC | PRN
  Filled 2013-04-16: qty 1

## 2013-04-16 MED ORDER — MONTELUKAST SODIUM 10 MG PO TABS
10.0000 mg | ORAL_TABLET | Freq: Every morning | ORAL | Status: DC
  Administered 2013-04-17 – 2013-04-18 (×2): 10 mg via ORAL
  Filled 2013-04-16 (×2): qty 1

## 2013-04-16 MED ORDER — ONDANSETRON HCL 4 MG/2ML IJ SOLN
INTRAMUSCULAR | Status: AC
  Administered 2013-04-16: 4 mg via INTRAVENOUS
  Filled 2013-04-16: qty 2

## 2013-04-16 MED ORDER — ONDANSETRON 4 MG PO TBDP
4.0000 mg | ORAL_TABLET | Freq: Once | ORAL | Status: DC
  Filled 2013-04-16: qty 1

## 2013-04-16 MED ORDER — ONDANSETRON HCL 4 MG PO TABS: 8.0000 mg | ORAL_TABLET | Freq: Three times a day (TID) | ORAL | Status: DC | PRN

## 2013-04-16 MED ORDER — FAMOTIDINE 20 MG PO TABS
20.0000 mg | ORAL_TABLET | Freq: Every day | ORAL | Status: DC
  Administered 2013-04-17 – 2013-04-18 (×2): 20 mg via ORAL
  Filled 2013-04-16 (×2): qty 1

## 2013-04-16 MED ORDER — WARFARIN SODIUM 4 MG PO TABS
4.0000 mg | ORAL_TABLET | ORAL | Status: AC
  Administered 2013-04-16: 4 mg via ORAL
  Filled 2013-04-16: qty 1

## 2013-04-16 MED ORDER — LORAZEPAM 2 MG/ML IJ SOLN
1.0000 mg | Freq: Once | INTRAMUSCULAR | Status: AC
  Administered 2013-04-16: 1 mg via INTRAVENOUS
  Filled 2013-04-16: qty 1

## 2013-04-16 MED ORDER — METOPROLOL SUCCINATE ER 50 MG PO TB24
50.0000 mg | ORAL_TABLET | Freq: Every day | ORAL | Status: DC
  Filled 2013-04-16 (×2): qty 1

## 2013-04-16 MED ORDER — SODIUM CHLORIDE 0.9 % IV BOLUS (SEPSIS)
1000.0000 mL | Freq: Once | INTRAVENOUS | Status: AC
  Administered 2013-04-16: 1000 mL via INTRAVENOUS

## 2013-04-16 MED ORDER — WARFARIN VIDEO
Freq: Once | Status: AC
  Administered 2013-04-17: 15:00:00

## 2013-04-16 MED ORDER — PROCHLORPERAZINE MALEATE 10 MG PO TABS
10.0000 mg | ORAL_TABLET | Freq: Four times a day (QID) | ORAL | Status: DC | PRN
  Filled 2013-04-16: qty 1

## 2013-04-16 MED ORDER — ONDANSETRON HCL 4 MG/2ML IJ SOLN
4.0000 mg | Freq: Once | INTRAMUSCULAR | Status: AC
  Administered 2013-04-16: 4 mg via INTRAVENOUS

## 2013-04-16 MED ORDER — LORAZEPAM 2 MG/ML IJ SOLN
1.0000 mg | Freq: Four times a day (QID) | INTRAMUSCULAR | Status: DC | PRN
  Administered 2013-04-16 – 2013-04-18 (×2): 1 mg via INTRAVENOUS
  Filled 2013-04-16 (×2): qty 1

## 2013-04-16 MED ORDER — COUMADIN BOOK
1.0000 | Freq: Once | Status: AC
  Administered 2013-04-17: 1
  Filled 2013-04-16: qty 1

## 2013-04-16 MED ORDER — WARFARIN - PHARMACIST DOSING INPATIENT: Freq: Every day | Status: DC

## 2013-04-16 MED ORDER — ENOXAPARIN SODIUM 60 MG/0.6ML ~~LOC~~ SOLN
55.0000 mg | Freq: Two times a day (BID) | SUBCUTANEOUS | Status: DC
  Administered 2013-04-16: 55 mg via SUBCUTANEOUS
  Filled 2013-04-16 (×3): qty 0.6

## 2013-04-16 MED ORDER — GABAPENTIN 300 MG PO CAPS
300.0000 mg | ORAL_CAPSULE | Freq: Every day | ORAL | Status: DC
  Administered 2013-04-16 – 2013-04-17 (×2): 300 mg via ORAL
  Filled 2013-04-16 (×3): qty 1

## 2013-04-16 MED ORDER — LORATADINE 10 MG PO TABS
10.0000 mg | ORAL_TABLET | Freq: Every day | ORAL | Status: DC
  Administered 2013-04-17 – 2013-04-18 (×2): 10 mg via ORAL
  Filled 2013-04-16 (×2): qty 1

## 2013-04-16 NOTE — Plan of Care (Addendum)
63 yo F diagnosed with PE yesterday, started xarelto x1 day ago, developed severe vomiting this AM.  No GIB.  Seems to be side effect of Xarelto?  Given EKG changes and large clot burden, EDP doesn't feel safe sending her home.  So sending [facility].  Probably plan lovenox bridge to coumadin, overnight obs.

## 2013-04-16 NOTE — ED Provider Notes (Signed)
CSN: [id number]     Arrival date & time 04/16/13  1623 History   First MD Initiated Contact with Patient 04/16/13 1641     Chief Complaint  Patient presents with  . Nausea     (Consider location/radiation/quality/duration/timing/severity/associated sxs/prior Treatment) Patient is a 63 y.o. female presenting with abdominal pain. The history is provided by the patient.  Abdominal Pain Pain location:  Epigastric Pain quality comment:  Nausea Pain radiates to:  Does not radiate Pain severity:  No pain Onset quality:  Sudden Timing:  Constant Progression:  Unchanged Chronicity:  New Context: recent illness (recent PE diagnosis, started on Xarelto yesterday)   Relieved by:  Nothing Ineffective treatments: Compazine, PO zofran. Associated symptoms: nausea and shortness of breath (improving)   Associated symptoms: no chest pain, no chills, no cough, no fever and no vomiting     Past Medical History  Diagnosis Date  . Palpitation   . Trigeminal neuralgia   . Chest pain   . Fluttering heart   . Irritable bowel syndrome   . Irregular heart rate   . Osteoporosis   . Diarrhea     The history was provided by the patient  . Vomiting     The history was provided by the patient  . Osteomyelitis     middle phalanx, right index finger  . Cervical spondylosis      with disk protrusion   Past Surgical History  Procedure Laterality Date  . Knee surgery    . Tonsillectomy    . Cervical disc surgery    . Tubal ligation    . Colonoscopy      with biopsies  . Upper gastrointestinal endoscopy       with biopsies   Family History  Problem Relation Age of Onset  . Hypertension Mother   . Coronary artery disease Father    History  Substance Use Topics  . Smoking status: Never Smoker   . Smokeless tobacco: Not on file  . Alcohol Use: No   OB History   Grav Para Term Preterm Abortions TAB SAB Ect Mult Living                 Review of Systems  Constitutional: Negative for  fever and chills.  Respiratory: Positive for shortness of breath (improving). Negative for cough.   Cardiovascular: Positive for palpitations. Negative for chest pain and leg swelling.  Gastrointestinal: Positive for nausea. Negative for vomiting and abdominal pain.  All other systems reviewed and are negative.     Allergies  Azithromycin; Betadine; Morphine; Povidone-iodine; and Promethazine hcl  Home Medications   Prior to Admission medications   Medication Sig Start Date End Date Taking? Authorizing Provider  CALCIUM PO Take 1,500 mg by mouth.      Historical Provider, MD  cetirizine (ZYRTEC) 10 MG tablet Take 10 mg by mouth daily.      Historical Provider, MD  Cholecalciferol (VITAMIN D PO) Take 2,000 Units by mouth.     Historical Provider, MD  gabapentin (NEURONTIN) 300 MG capsule 1 AT BED FOR 7 DAYS, TWICE A DAY FOR 7 DAYS, 3 TIMES A DAY, MAY INCREASE TO 4 CAPS 3 TIMES A DAY 03/25/13   Monica Bectonhomas J Thekkekandam, MD  hyoscyamine (LEVSIN, ANASPAZ) 0.125 MG tablet Take 1 tablet (0.125 mg total) by mouth every 4 (four) hours as needed for cramping. 03/16/13   Monica Bectonhomas J Thekkekandam, MD  metoprolol succinate (TOPROL-XL) 50 MG 24 hr tablet Take 1 tablet (50 mg total)  by mouth daily. Take with or immediately following a meal. 01/30/13   Vesta Mixer, MD  montelukast (SINGULAIR) 10 MG tablet Take 10 mg by mouth at bedtime.      Historical Provider, MD  ondansetron (ZOFRAN-ODT) 8 MG disintegrating tablet Take 1 tablet (8 mg total) by mouth every 8 (eight) hours as needed for nausea. 02/10/13   Monica Becton, MD  prochlorperazine (COMPAZINE) 10 MG tablet Take 1 tablet (10 mg total) by mouth every 6 (six) hours as needed for nausea or vomiting. 02/10/13   Monica Becton, MD  ranitidine (ZANTAC) 150 MG capsule Take 150 mg by mouth daily.      Historical Provider, MD   There were no vitals taken for this visit. Physical Exam  Nursing note and vitals reviewed. Constitutional: She is  oriented to person, place, and time. She appears well-developed and well-nourished. No distress.  HENT:  Head: Normocephalic and atraumatic.  Eyes: EOM are normal. Pupils are equal, round, and reactive to light.  Neck: Normal range of motion. Neck supple.  Cardiovascular: Normal rate and regular rhythm.  Exam reveals no friction rub.   No murmur heard. Pulmonary/Chest: Effort normal and breath sounds normal. No respiratory distress. She has no wheezes. She has no rales.  Abdominal: Soft. She exhibits no distension. There is no tenderness. There is no rebound.  Musculoskeletal: Normal range of motion. She exhibits no edema.  Neurological: She is alert and oriented to person, place, and time.  Skin: No rash noted. She is not diaphoretic.    ED Course  Procedures (including critical care time) Labs Review Labs Reviewed - No data to display  Imaging Review Ct Angio Chest Pe W/cm &/or Wo Cm  04/15/2013   CLINICAL DATA:  Shortness of breath and chest pain  EXAM: CT ANGIOGRAPHY CHEST WITH CONTRAST  TECHNIQUE: Multidetector CT imaging of the chest was performed using the standard protocol during bolus administration of intravenous contrast. Multiplanar CT image reconstructions and MIPs were obtained to evaluate the vascular anatomy.  CONTRAST:  OMNIPAQUE IOHEXOL 350 MG/ML SOLN  COMPARISON:  Chest CT March 26, 2004; chest radiograph April 14, 2013  FINDINGS: There is extensive pulmonary embolus in right lower lobe pulmonary artery branches. There are smaller pulmonary emboli in several left lower lobe pulmonary artery branches. There is no pulmonary embolus in the either main pulmonary outflow tract. There is no demonstrable right heart strain.  There is no thoracic aortic aneurysm or dissection.  There is underlying centrilobular emphysema. There is a somewhat wedge-shaped area of opacity in the lateral right base, upright representing an infarct. There is some surrounding patchy opacity which may  represent mild infiltrate is well. There is also some mild infiltrate as well as atelectasis in the left base. There is some bronchiectatic change in both upper and lower lobes.  There is no appreciable thoracic adenopathy. The pericardium is not thickened.  In the visualized upper abdomen, there is fatty liver. There are no blastic or lytic bone lesions. Visualized thyroid is normal.  Review of the MIP images confirms the above findings.  IMPRESSION: Multiple pulmonary emboli in both lower lobe pulmonary arteries, more on the right than on the left.  Question infarct lateral right base. There is also mild patchy infiltrate in the bases bilaterally, slightly more on the right than on the left.  Underlying emphysematous change with bronchiectatic change bilaterally, primarily in the lower lobes.  Fatty liver.  Critical Value/emergent results were communicated directly to Dr.  CATHERINE METHENEY , who verbally acknowledged these results.   Electronically Signed   By: Bretta BangWilliam  Woodruff M.D.   On: 04/15/2013 12:50     EKG Interpretation None      Date: 04/16/2013  Rate: 98  Rhythm: normal sinus rhythm  QRS Axis: normal  Intervals: normal  ST/T Wave abnormalities: nonspecific T wave changes and T wave flattening in V2, T inversion V3  Conduction Disutrbances:none  Narrative Interpretation:   Old EKG Reviewed: none available    MDM   Final diagnoses:  Pulmonary embolism  Nausea    63 year old female presents with nausea. Diagnosed with PE yesterday. Presented to her primary Dr. 2 days ago with palpitations, shortness of breath. D-dimer at that time elevated, and subsequent CT PE study obtained. CT angiogram showed multiple bilateral PEs, with large burden in right lower lobe. Lung infarct present. Patient placed on Xarelto and then discharged to followup with her PCP.  Today, had severe nausea. Denies chest pain. Shortness of breath is greatly improving.  On exam, lungs clear. Persistent  tachcyardia in the 110s.  Will obtain EKG, labs.  EKG with anterior T wave changes. Labs normal. Nausea required 2nd agent - Ativan - to control. With her infarct, multiple bilateral PEs, adverse effects from Xarelto, felt admission warranted to help figure out anti-coagulation. [facility]   Dagmar HaitWilliam Cherrell Maybee, MD 04/16/13 [id number]

## 2013-04-16 NOTE — Telephone Encounter (Signed)
Pt stated that she is really trying to get fluids in however it has been really hard for her to do so. She is not vomiting just very nauseated. She stated that when she voids it's clear and has had about 12oz. Informed/advised her that she should go to the ED she would like to go to [facility] in [place]. Called the ED and was told that I could send over what Dr. Linford ArnoldMetheney felt like the pt should have and fax it to [phone] and they would inform the Dr there.Deno Etienneonya L Petros Ahart'

## 2013-04-16 NOTE — H&P (Signed)
[facility] History and Physical  Kathleen Garcia Fralix ZOX:[id number] DOB: 04/15/1950 DOA: 04/16/2013  Referring physician: EDP PCP: Rodney LangtonHEKKEKANDAM, THOMAS, MD   Chief Complaint: Nausea   HPI: Kathleen Garcia Jeter is a 63 y.o. female who was just diagnosed with PE on 4/14, and started on xarelto 4/15.  Today she developed severe nausea with dry heaves, she tried taking compazine and PO zofran at home and these did not help.  [facility]  Her SOB that she initially had on 4/14 with the PE is actually improved today compared to then she states.  She denies chest pain despite the large PE and pulmonary infarction seen on CT scan.  No melena, no hematochezia, no hematemesis or other stigmata of GIB.  Review of Systems: Systems reviewed.  As above, otherwise negative  Past Medical History  Diagnosis Date  . Palpitation   . Trigeminal neuralgia   . Chest pain   . Fluttering heart   . Irritable bowel syndrome   . Irregular heart rate   . Osteoporosis   . Diarrhea     The history was provided by the patient  . Vomiting     The history was provided by the patient  . Osteomyelitis     middle phalanx, right index finger  . Cervical spondylosis      with disk protrusion   Past Surgical History  Procedure Laterality Date  . Knee surgery    . Tonsillectomy    . Cervical disc surgery    . Tubal ligation    . Colonoscopy      with biopsies  . Upper gastrointestinal endoscopy       with biopsies   Social History:  reports that she has never smoked. She does not have any smokeless tobacco history on file. She reports that she does not drink alcohol or use illicit drugs.  Allergies  Allergen Reactions  . Morphine Anaphylaxis    Swelling   . Azithromycin Nausea And Vomiting  . Betadine [Povidone Iodine]   . Povidone-Iodine   . Promethazine Hcl     Family History  Problem Relation Age of Onset  . Hypertension Mother   . Coronary artery disease Father      Prior to  Admission medications   Medication Sig Start Date End Date Taking? Authorizing Provider  CALCIUM PO Take 1,500 mg by mouth.     Yes Historical Provider, MD  cetirizine (ZYRTEC) 10 MG tablet Take 10 mg by mouth daily.     Yes Historical Provider, MD  Cholecalciferol (VITAMIN D PO) Take 2,000 Units by mouth.    Yes Historical Provider, MD  gabapentin (NEURONTIN) 300 MG capsule Take 300 mg by mouth at bedtime.    Yes Historical Provider, MD  hyoscyamine (LEVSIN, ANASPAZ) 0.125 MG tablet Take 1 tablet (0.125 mg total) by mouth every 4 (four) hours as needed for cramping. 03/16/13  Yes Monica Bectonhomas J Thekkekandam, MD  montelukast (SINGULAIR) 10 MG tablet Take 10 mg by mouth every morning.    Yes Historical Provider, MD  ondansetron (ZOFRAN) 8 MG tablet Take 8 mg by mouth every 8 (eight) hours as needed for nausea or vomiting.   Yes Historical Provider, MD  prochlorperazine (COMPAZINE) 10 MG tablet Take 10 mg by mouth every 6 (six) hours as needed for nausea or vomiting.   Yes Historical Provider, MD  ranitidine (ZANTAC) 150 MG capsule Take 150 mg by mouth daily.     Yes Historical Provider, MD  metoprolol succinate (TOPROL-XL) 50 MG 24 hr tablet Take 1 tablet (50 mg total) by mouth daily. Take with or immediately following a meal. 01/30/13   Vesta MixerPhilip J Nahser, MD   Physical Exam: Filed Vitals:   04/16/13 2218  BP: 112/50  Pulse: 102  Temp: 98 F (36.7 C)  Resp: 18    BP 112/50  Pulse 102  Temp(Src) 98 F (36.7 C) (Oral)  Resp 18  Ht 5\' 2"  (1.575 Garcia)  Wt 54.7 kg (120 lb 9.5 oz)  BMI 22.05 kg/m2  SpO2 97%  General Appearance:    Alert, oriented, no distress, appears stated age  Head:    Normocephalic, atraumatic  Eyes:    PERRL, EOMI, sclera non-icteric        Nose:   Nares without drainage or epistaxis. Mucosa, turbinates normal  Throat:   Moist mucous membranes. Oropharynx without erythema or exudate.  Neck:   Supple. No carotid bruits.  No thyromegaly.  No lymphadenopathy.   Back:     No CVA  tenderness, no spinal tenderness  Lungs:     Clear to auscultation bilaterally, without wheezes, rhonchi or rales  Chest wall:    No tenderness to palpitation  Heart:    Regular rate and rhythm without murmurs, gallops, rubs  Abdomen:     Soft, non-tender, nondistended, normal bowel sounds, no organomegaly  Genitalia:    deferred  Rectal:    deferred  Extremities:   No clubbing, cyanosis or edema.  Pulses:   2+ and symmetric all extremities  Skin:   Skin color, texture, turgor normal, no rashes or lesions  Lymph nodes:   Cervical, supraclavicular, and axillary nodes normal  Neurologic:   CNII-XII intact. Normal strength, sensation and reflexes      throughout    Labs on Admission:  Basic Metabolic Panel:  Recent Labs Lab 04/14/13 1602 04/16/13 1705  NA 139 140  K 3.8 3.7  CL 100 100  CO2 30 25  GLUCOSE 83 106*  BUN 8 5*  CREATININE 0.67 0.70  CALCIUM 9.5 9.5   Liver Function Tests:  Recent Labs Lab 04/14/13 1602  AST 18  ALT 9  ALKPHOS 60  BILITOT 0.3  PROT 7.3  ALBUMIN 4.2   No results found for this basename: LIPASE, AMYLASE,  in the last 168 hours No results found for this basename: AMMONIA,  in the last 168 hours CBC:  Recent Labs Lab 04/14/13 1602 04/16/13 1705  WBC 7.4 8.3  NEUTROABS 4.8  --   HGB 12.6 13.5  HCT 35.4* 39.5  MCV 85.3 91.2  PLT 266 268   Cardiac Enzymes:  Recent Labs Lab 04/16/13 1705  TROPONINI <0.30    BNP (last 3 results) No results found for this basename: PROBNP,  in the last 8760 hours CBG: No results found for this basename: GLUCAP,  in the last 168 hours  Radiological Exams on Admission: Ct Angio Chest Pe W/cm &/or Wo Cm  04/15/2013   CLINICAL DATA:  Shortness of breath and chest pain  EXAM: CT ANGIOGRAPHY CHEST WITH CONTRAST  TECHNIQUE: Multidetector CT imaging of the chest was performed using the standard protocol during bolus administration of intravenous contrast. Multiplanar CT image reconstructions and MIPs  were obtained to evaluate the vascular anatomy.  CONTRAST:  100mL OMNIPAQUE IOHEXOL 350 MG/ML SOLN  COMPARISON:  Chest CT March 26, 2004; chest radiograph April 14, 2013  FINDINGS: There is extensive pulmonary embolus in right lower lobe pulmonary artery branches. There are  smaller pulmonary emboli in several left lower lobe pulmonary artery branches. There is no pulmonary embolus in the either main pulmonary outflow tract. There is no demonstrable right heart strain.  There is no thoracic aortic aneurysm or dissection.  There is underlying centrilobular emphysema. There is a somewhat wedge-shaped area of opacity in the lateral right base, upright representing an infarct. There is some surrounding patchy opacity which may represent mild infiltrate is well. There is also some mild infiltrate as well as atelectasis in the left base. There is some bronchiectatic change in both upper and lower lobes.  There is no appreciable thoracic adenopathy. The pericardium is not thickened.  In the visualized upper abdomen, there is fatty liver. There are no blastic or lytic bone lesions. Visualized thyroid is normal.  Review of the MIP images confirms the above findings.  IMPRESSION: Multiple pulmonary emboli in both lower lobe pulmonary arteries, more on the right than on the left.  Question infarct lateral right base. There is also mild patchy infiltrate in the bases bilaterally, slightly more on the right than on the left.  Underlying emphysematous change with bronchiectatic change bilaterally, primarily in the lower lobes.  Fatty liver.  Critical Value/emergent results were communicated directly to Dr. Nani Gasser , who verbally acknowledged these results.   Electronically Signed   By: Bretta Bang Garcia.D.   On: 04/15/2013 12:50    EKG: Independently reviewed. EKG does show non specific T wave changes.  Assessment/Plan Principal Problem:   PE (pulmonary embolism) Active Problems:   Nausea alone   1. PE -  large clot burden, with possible pulmonary infarct, high suspicion for RV strain with EKG changes noted (getting 2d echo to confirm this).  Stopping xarelto due to possible side effect of nausea and trying lovenox bridge to coumadin instead. 2. Nausea - Possibly due either to PE (although would be an unusual symptom for PE), or as a side effect of Xarelto.  Given the possibility of the latter will stop xarelto and try lovenox bridge to coumadin instead to see if that resolves her ongoing nausea.  In the mean time for symptomatic control, the only med that seemed to work for her at [facility] was Ativan, so will continue this PRN nausea.  Code Status: Full Code  Family Communication: No family in room Disposition Plan: Admit to obs   Time spent: 70 min  Hillary Bow [facility] Pager [phone]  If 7AM-7PM, please contact the day team taking care of the patient [email or web address] Password [facility] 04/16/2013, 10:56 PM

## 2013-04-16 NOTE — Telephone Encounter (Signed)
Pt called and stated that she took the compazine this morning and she is still feeling nauseated. She has not been able to really eat anything due to feeling so poorly. She denies any fevers, SOB, or palpitations. She would like to know what else she can try to help with the nausea. Kathleen Garcia.Nathanyel Defenbaugh L Kiyaan Haq

## 2013-04-16 NOTE — Telephone Encounter (Signed)
PA obtained for CTA with and w/o contrast. Auth # [id number].  Meyer CoryMisty Ahmad, LPN

## 2013-04-16 NOTE — Telephone Encounter (Signed)
Can take the zofran and the compazine if needed. If nausea is severe or if vomiting then needs to go to ED.

## 2013-04-16 NOTE — ED Notes (Signed)
Pt DX multiple PE started xerelto  X 1 day ago this am severe vomiting

## 2013-04-17 ENCOUNTER — Ambulatory Visit: Payer: BC Managed Care – PPO | Admitting: Family Medicine

## 2013-04-17 ENCOUNTER — Telehealth: Payer: Self-pay | Admitting: Cardiovascular Disease

## 2013-04-17 DIAGNOSIS — I359 Nonrheumatic aortic valve disorder, unspecified: Secondary | ICD-10-CM

## 2013-04-17 LAB — CBC
HCT: 34.2 % — ABNORMAL LOW (ref 36.0–46.0)
Hemoglobin: 11.6 g/dL — ABNORMAL LOW (ref 12.0–15.0)
MCH: 30.2 pg (ref 26.0–34.0)
MCHC: 33.9 g/dL (ref 30.0–36.0)
MCV: 89.1 fL (ref 78.0–100.0)
Platelets: 234 10*3/uL (ref 150–400)
RBC: 3.84 MIL/uL — ABNORMAL LOW (ref 3.87–5.11)
RDW: 12.8 % (ref 11.5–15.5)
WBC: 7.5 10*3/uL (ref 4.0–10.5)

## 2013-04-17 LAB — BASIC METABOLIC PANEL
BUN: 4 mg/dL — ABNORMAL LOW (ref 6–23)
CO2: 25 mEq/L (ref 19–32)
Calcium: 8.2 mg/dL — ABNORMAL LOW (ref 8.4–10.5)
Chloride: 103 mEq/L (ref 96–112)
Creatinine, Ser: 0.68 mg/dL (ref 0.50–1.10)
GFR calc non Af Amer: >90 mL/min (ref >90)
Glucose, Bld: 88 mg/dL (ref 70–99)
POTASSIUM: 3.4 meq/L — AB (ref 3.7–5.3)
Sodium: 138 mEq/L (ref 137–147)

## 2013-04-17 LAB — PROTIME-INR
INR: 1.63 — AB (ref 0.00–1.49)
PROTHROMBIN TIME: 18.9 s — AB (ref 11.6–15.2)

## 2013-04-17 MED ORDER — WARFARIN SODIUM 4 MG PO TABS
4.0000 mg | ORAL_TABLET | Freq: Once | ORAL | Status: AC
  Administered 2013-04-17: 4 mg via ORAL
  Filled 2013-04-17: qty 1

## 2013-04-17 MED ORDER — ENOXAPARIN SODIUM 80 MG/0.8ML ~~LOC~~ SOLN
80.0000 mg | Freq: Every day | SUBCUTANEOUS | Status: DC
  Administered 2013-04-17 – 2013-04-18 (×2): 80 mg via SUBCUTANEOUS
  Filled 2013-04-17 (×2): qty 0.8

## 2013-04-17 NOTE — Progress Notes (Signed)
ANTICOAGULATION CONSULT NOTE - Follow Up Consult  Pharmacy Consult for Warfarin, Enoxaparin Indication: pulmonary embolus  Allergies  Allergen Reactions  . Morphine Anaphylaxis    Swelling   . Azithromycin Nausea And Vomiting  . Betadine [Povidone Iodine]   . Povidone-Iodine   . Promethazine Hcl     Patient Measurements: Height: 5\' 2"  (157.5 cm) Weight: 120 lb 9.5 oz (54.7 kg) IBW/kg (Calculated) : 50.1  Vital Signs: Temp: 98.3 F (36.8 C) (04/17 0621) Temp src: Oral (04/17 0621) BP: 103/53 mmHg (04/17 0621) Pulse Rate: 94 (04/17 0621)  Labs:  Recent Labs  04/14/13 1602 04/16/13 1705 04/17/13 0339  HGB 12.6 13.5 11.6*  HCT 35.4* 39.5 34.2*  PLT 266 268 234  LABPROT  --   --  18.9*  INR  --   --  1.63*  CREATININE 0.67 0.70 0.68  TROPONINI  --  <0.30  --     Estimated Creatinine Clearance: 57.7 ml/min (by C-G formula based on Cr of 0.68).   Medications:  Scheduled:  . coumadin book  1 each Does not apply Once  . enoxaparin (LOVENOX) injection  55 mg Subcutaneous BID  . famotidine  20 mg Oral Daily  . gabapentin  300 mg Oral QHS  . loratadine  10 mg Oral Daily  . metoprolol succinate  50 mg Oral Daily  . montelukast  10 mg Oral q morning - 10a  . warfarin   Does not apply Once  . Warfarin - Pharmacist Dosing Inpatient   Does not apply q1800   Infusions:    Warfarin doses:  4mg  4/16,   Assessment: Kathleen Garcia admitted 4/16 with nausea and recent PE (diagnosed 4/14) and started on anticoagulation with Xarelto on 4/15.  Pharmacy consulted to transition from Xarelto to warfarin dosing with Lovenox bridge.   INR 1.63, but rivaroxaban affects INR; therefore, initial INR measurements after initiating warfarin may be unreliable.  CBC:  Hgb decreased to 11.6, Plt 234.  No bleeding or complications noted.  SCr 0.7, CrCl ~ 57 ml/min  Diet: regular diet, pt denies further nausea/vomitting.  Drug interactions: Xarelto 15mg  BID, now discontinued (last dose 4/16 at  1000)   Goal of Therapy:  INR 2-3 Monitor platelets by anticoagulation protocol: Yes   Plan:   Change to once daily, Lovenox 80mg  SQ q24h   Warfarin 4mg  PO today at 1800  Daily CBC, INR   Lynann Beaverhristine Merly Hinkson PharmD, BCPS Pager [phone] 04/17/2013 8:38 AM

## 2013-04-17 NOTE — Progress Notes (Signed)
UR completed 

## 2013-04-17 NOTE — Care Management Note (Signed)
    Page 1 of 1   04/17/2013     11:11:36 AM CARE MANAGEMENT NOTE 04/17/2013  Patient:  Kathleen Garcia,Kathleen Garcia   Account Number:  [id number]  Date Initiated:  04/17/2013  Documentation initiated by:  Lanier ClamMAHABIR,Lorijean Husser  Subjective/Objective Assessment:   63 Y/O F ADMITTED W/BILATERAL PE.     Action/Plan:   FROM HOME.HAS PCP,PHARMACY.   Anticipated DC Date:  04/18/2013   Anticipated DC Plan:  HOME/SELF CARE      DC Planning Services  CM consult      Choice offered to / List presented to:             Status of service:  In process, will continue to follow Medicare Important Message given?   (If response is "NO", the following Medicare IM given date fields will be blank) Date Medicare IM given:   Date Additional Medicare IM given:    Discharge Disposition:    Per UR Regulation:  Reviewed for med. necessity/level of care/duration of stay  If discussed at [place] Length of Stay Meetings, dates discussed:    Comments:  04/17/13 Kathleen Winther RN,BSN NCM [phone] NO ANTICIPATED D/C NEEDS.

## 2013-04-17 NOTE — Progress Notes (Signed)
Note: This document was prepared with digital dictation and possible smart phrase technology. Any transcriptional errors that result from this process are unintentional.   Kathleen Garcia [id number] DOB: 04/08/1950 DOA: 04/16/2013 PCP: Rodney LangtonHEKKEKANDAM, THOMAS, MD  Brief narrative: 63 y/o ?, no significant pmh other than arthritis and on HRT admitted to the hospital 04/16/13 with acute pulmonary embolism. This was found after her being persistently short of breath, fatigue and she went to primary care physician's office had a workup and found to have PE and started on Xarelto.  She'll force was tolerate this and had excessive nausea and vomiting which was recalcitrant to Zofran and Compazine. She was admitted for management of the same  Past medical history-As per Problem list Chart reviewed as below- Reviewed  Consultants:  None  Procedures:  None  Antibiotics:  None   Subjective  He is much better. Eating lunch. No nausea no vomiting no chest pain. No shortness of breath No blurred or double vision no bleeding   Objective    Interim History: None  Telemetry: Sinus   Objective: Filed Vitals:   04/16/13 2059 04/16/13 2218 04/17/13 0621 04/17/13 0824  BP: 117/50 112/50 103/53 100/43  Pulse: 108 102 94 100  Temp:  98 F (36.7 C) 98.3 F (36.8 C) 98.2 F (36.8 C)  TempSrc:  Oral Oral Oral  Resp:  18 18 20   Height:  5\' 2"  (1.575 m)    Weight:  54.7 kg (120 lb 9.5 oz)    SpO2: 97% 97% 97% 97%    Intake/Output Summary (Last 24 hours) at 04/17/13 1403 Last data filed at 04/17/13 0700  Gross per 24 hour  Intake    240 ml  Output      0 ml  Net    240 ml    Exam:  General: EOMI NCAT Cardiovascular: S1-S2 no murmur rub or gallop Respiratory: Clinically clear Abdomen: Soft nontender nondistended no rebound Skin intact Neuro no lower extremity edema grossly intact moving all 4 limbs equally  Data Reviewed: Basic Metabolic Panel:  Recent Labs Lab  04/14/13 1602 04/16/13 1705 04/17/13 0339  NA 139 140 138  K 3.8 3.7 3.4*  CL 100 100 103  CO2 30 25 25   GLUCOSE 83 106* 88  BUN 8 5* 4*  CREATININE 0.67 0.70 0.68  CALCIUM 9.5 9.5 8.2*   Liver Function Tests:  Recent Labs Lab 04/14/13 1602  AST 18  ALT 9  ALKPHOS 60  BILITOT 0.3  PROT 7.3  ALBUMIN 4.2   No results found for this basename: LIPASE, AMYLASE,  in the last 168 hours No results found for this basename: AMMONIA,  in the last 168 hours CBC:  Recent Labs Lab 04/14/13 1602 04/16/13 1705 04/17/13 0339  WBC 7.4 8.3 7.5  NEUTROABS 4.8  --   --   HGB 12.6 13.5 11.6*  HCT 35.4* 39.5 34.2*  MCV 85.3 91.2 89.1  PLT 266 268 234   Cardiac Enzymes:  Recent Labs Lab 04/16/13 1705  TROPONINI <0.30   BNP: No components found with this basename: POCBNP,  CBG: No results found for this basename: GLUCAP,  in the last 168 hours  No results found for this or any previous visit (from the past 240 hour(s)).   Studies:              All Imaging reviewed and is as per above notation   Scheduled Meds: . enoxaparin (LOVENOX) injection  80 mg Subcutaneous Daily  .  famotidine  20 mg Oral Daily  . gabapentin  300 mg Oral QHS  . loratadine  10 mg Oral Daily  . metoprolol succinate  50 mg Oral Daily  . montelukast  10 mg Oral q morning - 10a  . warfarin  4 mg Oral ONCE-1800  . warfarin   Does not apply Once  . Warfarin - Pharmacist Dosing Inpatient   Does not apply q1800   Continuous Infusions:    Assessment/Plan: 1. Pulmonary embolism-initally was on  Xarelto but had intolerance to the same . Placed on Lovenox to Coumadin bridge. We'll repeat INR in the morning 4/18.  Her INR today is 1.6 with hemoglobin of 11.6 We will determine the dosage of discharge Coumadin dose based on that and will continue Lovenox for at least 3 to 4 days for complete overlap. She probably will be discharged more never ready arranged a close followup appointment with her primary care  physician Monday 4/20 as well as an INR check.  Echocardiogram performed and still pending  Code Status: Full Family Communication: Sr. at bedside and discuss plan of care with her Disposition Plan: Inpatient   Pleas KochJai Shaquan Puerta, MD  [facility] Pager [phone] 04/17/2013, 2:03 PM    LOS: 1 day

## 2013-04-17 NOTE — Telephone Encounter (Signed)
New message    Patient calling - admit to hospital wanted Dr. Elease HashimotoNahser to know.  Kathleen Garcia  [phone].

## 2013-04-17 NOTE — Discharge Instructions (Signed)
Information on my medicine - Coumadin   (Warfarin)  This medication education was reviewed with me or my healthcare representative as part of my discharge preparation.  The pharmacist that spoke with me during my hospital stay was:  Reece Packerhomas Robert Arby Dahir, [facility] HospitalRPH  Why was Coumadin prescribed for you? Coumadin was prescribed for you because you have a blood clot or a medical condition that can cause an increased risk of forming blood clots. Blood clots can cause serious health problems by blocking the flow of blood to the heart, lung, or brain. Coumadin can prevent harmful blood clots from forming. As a reminder your indication for Coumadin is:   Pulmonary Embolism Treatment  What test will check on my response to Coumadin? While on Coumadin (warfarin) you will need to have an INR test regularly to ensure that your dose is keeping you in the desired range. The INR (international normalized ratio) number is calculated from the result of the laboratory test called prothrombin time (PT).  If an INR APPOINTMENT HAS NOT ALREADY BEEN MADE FOR YOU please schedule an appointment to have this lab work done by your health care provider within 7 days. Your INR goal is usually a number between:  2 to 3 or your provider may give you a more narrow range like 2-2.5.  Ask your health care provider during an office visit what your goal INR is.  What  do you need to  know  About  COUMADIN? Take Coumadin (warfarin) exactly as prescribed by your healthcare provider about the same time each day.  DO NOT stop taking without talking to the doctor who prescribed the medication.  Stopping without other blood clot prevention medication to take the place of Coumadin may increase your risk of developing a new clot or stroke.  Get refills before you run out.  What do you do if you miss a dose? If you miss a dose, take it as soon as you remember on the same day then continue your regularly scheduled regimen the next day.  Do not  take two doses of Coumadin at the same time.  Important Safety Information A possible side effect of Coumadin (Warfarin) is an increased risk of bleeding. You should call your healthcare provider right away if you experience any of the following:   Bleeding from an injury or your nose that does not stop.   Unusual colored urine (red or dark brown) or unusual colored stools (red or black).   Unusual bruising for unknown reasons.   A serious fall or if you hit your head (even if there is no bleeding).  Some foods or medicines interact with Coumadin (warfarin) and might alter your response to warfarin. To help avoid this:   Eat a balanced diet, maintaining a consistent amount of Vitamin K.   Notify your provider about major diet changes you plan to make.   Avoid alcohol or limit your intake to 1 drink for women and 2 drinks for men per day. (1 drink is 5 oz. wine, 12 oz. beer, or 1.5 oz. liquor.)  Make sure that ANY health care provider who prescribes medication for you knows that you are taking Coumadin (warfarin).  Also make sure the healthcare provider who is monitoring your Coumadin knows when you have started a new medication including herbals and non-prescription products.  Coumadin (Warfarin)  Major Drug Interactions  Increased Warfarin Effect Decreased Warfarin Effect  Alcohol (large quantities) Antibiotics (esp. Septra/Bactrim, Flagyl, Cipro) Amiodarone (Cordarone) Aspirin (ASA) Cimetidine (Tagamet)  Megestrol (Megace) °NSAIDs (ibuprofen, naproxen, etc.) °Piroxicam (Feldene) °Propafenone (Rythmol SR) °Propranolol (Inderal) °Isoniazid (INH) °Posaconazole (Noxafil) Barbiturates (Phenobarbital) °Carbamazepine (Tegretol) °Chlordiazepoxide (Librium) °Cholestyramine (Questran) °Griseofulvin °Oral Contraceptives °Rifampin °Sucralfate (Carafate) °Vitamin K  ° °Coumadin® (Warfarin) Major Herbal Interactions  °Increased Warfarin Effect Decreased Warfarin Effect  °Garlic °Ginseng °Ginkgo biloba  Coenzyme Q10 °Green tea °St. John’s wort   ° °Coumadin® (Warfarin) FOOD Interactions  °Eat a consistent number of servings per week of foods HIGH in Vitamin K °(1 serving = ½ cup)  °Collards (cooked, or boiled & drained) °Kale (cooked, or boiled & drained) °Mustard greens (cooked, or boiled & drained) °Parsley *serving size only = ¼ cup °Spinach (cooked, or boiled & drained) °Swiss chard (cooked, or boiled & drained) °Turnip greens (cooked, or boiled & drained)  °Eat a consistent number of servings per week of foods MEDIUM-HIGH in Vitamin K °(1 serving = 1 cup)  °Asparagus (cooked, or boiled & drained) °Broccoli (cooked, boiled & drained, or raw & chopped) °Brussel sprouts (cooked, or boiled & drained) *serving size only = ½ cup °Lettuce, raw (green leaf, endive, romaine) °Spinach, raw °Turnip greens, raw & chopped  ° °These websites have more information on Coumadin (warfarin):  www.coumadin.com; °www.ahrq.gov/consumer/coumadin.htm; ° ° °

## 2013-04-17 NOTE — Progress Notes (Signed)
ANTICOAGULATION CONSULT NOTE - Initial Consult  Pharmacy Consult for warfarin, enoxaparin Indication: pulmonary embolus  Allergies  Allergen Reactions  . Morphine Anaphylaxis    Swelling   . Azithromycin Nausea And Vomiting  . Betadine [Povidone Iodine]   . Povidone-Iodine   . Promethazine Hcl     Patient Measurements: Height: 5\' 2"  (157.5 cm) Weight: 120 lb 9.5 oz (54.7 kg) IBW/kg (Calculated) : 50.1 Heparin Dosing Weight:   Vital Signs: Temp: 98 F (36.7 C) (04/16 2218) Temp src: Oral (04/16 2218) BP: 112/50 mmHg (04/16 2218) Pulse Rate: 102 (04/16 2218)  Labs:  Recent Labs  04/14/13 1602 04/16/13 1705 04/17/13 0339  HGB 12.6 13.5 11.6*  HCT 35.4* 39.5 34.2*  PLT 266 268 234  LABPROT  --   --  18.9*  INR  --   --  1.63*  CREATININE 0.67 0.70 0.68  TROPONINI  --  <0.30  --     Estimated Creatinine Clearance: 57.7 ml/min (by C-G formula based on Cr of 0.68).   Medical History: Past Medical History  Diagnosis Date  . Palpitation   . Trigeminal neuralgia   . Chest pain   . Fluttering heart   . Irritable bowel syndrome   . Irregular heart rate   . Osteoporosis   . Diarrhea     The history was provided by the patient  . Vomiting     The history was provided by the patient  . Osteomyelitis     middle phalanx, right index finger  . Cervical spondylosis      with disk protrusion    Medications:  Prescriptions prior to admission  Medication Sig Dispense Refill  . CALCIUM PO Take 1,500 mg by mouth.        . cetirizine (ZYRTEC) 10 MG tablet Take 10 mg by mouth daily.        . Cholecalciferol (VITAMIN D PO) Take 2,000 Units by mouth.       . gabapentin (NEURONTIN) 300 MG capsule Take 300 mg by mouth at bedtime.       . hyoscyamine (LEVSIN, ANASPAZ) 0.125 MG tablet Take 1 tablet (0.125 mg total) by mouth every 4 (four) hours as needed for cramping.  90 tablet  3  . montelukast (SINGULAIR) 10 MG tablet Take 10 mg by mouth every morning.       .  ondansetron (ZOFRAN) 8 MG tablet Take 8 mg by mouth every 8 (eight) hours as needed for nausea or vomiting.      . prochlorperazine (COMPAZINE) 10 MG tablet Take 10 mg by mouth every 6 (six) hours as needed for nausea or vomiting.      . ranitidine (ZANTAC) 150 MG capsule Take 150 mg by mouth daily.        . [DISCONTINUED] Rivaroxaban (XARELTO) 15 MG TABS tablet Take 15 mg by mouth 2 (two) times daily with a meal.      . metoprolol succinate (TOPROL-XL) 50 MG 24 hr tablet Take 1 tablet (50 mg total) by mouth daily. Take with or immediately following a meal.  90 tablet  3   Scheduled:  . coumadin book  1 each Does not apply Once  . enoxaparin (LOVENOX) injection  55 mg Subcutaneous BID  . famotidine  20 mg Oral Daily  . gabapentin  300 mg Oral QHS  . loratadine  10 mg Oral Daily  . metoprolol succinate  50 mg Oral Daily  . montelukast  10 mg Oral q morning - 10a  .  warfarin   Does not apply Once  . Warfarin - Pharmacist Dosing Inpatient   Does not apply q1800    Assessment: Patient with PE and on xarelto PTA.  Last dose noted to be 4/16 1000--15mg  bid.  MD wishes to change to warfarin with enoxaparin bridge.  Goal of Therapy:  INR 2-3 Anti-Xa level 0.6-1 units/ml 4hrs after LMWH dose given Monitor platelets by anticoagulation protocol: Yes   Plan:  Start with Coumadin 4 mg tonight. Check PT/INR daily. Provide Coumadin education. Enoxaparin 55mg  sq q12hr  [employer] Specialty ChemicalsJulian Crowford Peggy Monk Jr. 04/17/2013,5:16 AM

## 2013-04-17 NOTE — Progress Notes (Signed)
*  PRELIMINARY RESULTS* Echocardiogram 2D Echocardiogram has been performed.  [name] 04/17/2013, 12:02 PM

## 2013-04-17 NOTE — Telephone Encounter (Signed)
Will forward to Dr. Nahser. °

## 2013-04-17 NOTE — Progress Notes (Signed)
Patient laying in bed with eyes open watching TV upon entering room.  Patient is alert to person place and time.  Patient head is normocephalic with no lesions.  Ears are in line with lateral canvas; bend with no pain; no lesions present.  Eyes react and accommodate to light; sclera is normal for race; conjunctiva is moist and pink.  Patient does wear glasses.  Mouth is moist and pink; teeth intact.  Nose is midline with no deviated septum.  Skin is dry and intact.  Heart rate and rhythm within normal limits.  Radial and pedal pulses 2+.  Lung sounds clear and diminished.  Abdomen is active with no lesions present. Patient is comfortable and does not need anything at the moment.  Lorn Junesenise Decarlo Rivet, SN  Filed Vitals:   04/17/13 0824  BP: 100/43  Pulse: 100  Temp: 98.2 F (36.8 C)  Resp: 20

## 2013-04-17 NOTE — Progress Notes (Signed)
Patient laying in bed with eyes open facing the TV upon entering room.  Alert and orientated to person, place, and time.  Patient heart rate and rhythm within normal limits.  Lung sounds clear and diminished.  Active bowel sounds heard upon auscultation.  Patient stated that she was ready to watch Warfarin educational video.  Called and set up video.  Patient comfortable and did not need anything else at the moment.  Lorn Junesenise Gianina Olinde, SN  Filed Vitals:   04/17/13 1414  BP: 116/53  Pulse: 102  Temp:   Resp: 20

## 2013-04-18 LAB — PROTIME-INR
INR: 1.16 (ref 0.00–1.49)
PROTHROMBIN TIME: 14.6 s (ref 11.6–15.2)

## 2013-04-18 MED ORDER — ENOXAPARIN SODIUM 80 MG/0.8ML ~~LOC~~ SOLN: 80.0000 mg | Freq: Every day | SUBCUTANEOUS | Status: DC

## 2013-04-18 MED ORDER — WARFARIN SODIUM 3 MG PO TABS: 3.0000 mg | ORAL_TABLET | Freq: Every day | ORAL | Status: DC

## 2013-04-18 NOTE — Progress Notes (Signed)
CARE MANAGEMENT NOTE 04/18/2013  Patient:  Kathleen Garcia,Kathleen Garcia   Account Number:  [id number]  Date Initiated:  04/17/2013  Documentation initiated by:  Lanier ClamMAHABIR,KATHY  Subjective/Objective Assessment:   63 Y/O F ADMITTED W/BILATERAL PE.     Action/Plan:   FROM HOME.HAS PCP,PHARMACY.   Anticipated DC Date:  04/18/2013   Anticipated DC Plan:  HOME/SELF CARE      DC Planning Services  CM consult  Medication Assistance      Choice offered to / List presented to:             Status of service:  Completed, signed off Medicare Important Message given?   (If response is "NO", the following Medicare IM given date fields will be blank) Date Medicare IM given:   Date Additional Medicare IM given:    Discharge Disposition:  HOME/SELF CARE  Per UR Regulation:  Reviewed for med. necessity/level of care/duration of stay  If discussed at [place] Length of Stay Meetings, dates discussed:    Comments:  04/18/2013 1130 NCM spoke to pt and [facility] [place] and Lovenox copay is $37.00. Made pt aware. Isidoro DonningAlesia Janiylah Hannis RN CCM Case Mgmt phone [phone]  04/17/13 KATHY MAHABIR RN,BSN NCM [phone] NO ANTICIPATED D/C NEEDS.

## 2013-04-18 NOTE — Progress Notes (Signed)
Pt. demonsrated correct injection technique of lovenox.

## 2013-04-18 NOTE — Discharge Summary (Signed)
Physician Discharge Summary  Kathleen Garcia EAV:[id number] DOB: 11/13/1950 DOA: 04/16/2013  PCP: Rodney LangtonHEKKEKANDAM, THOMAS, MD  Admit date: 04/16/2013 Discharge date: 04/18/2013  Time spent: 35 minutes  Recommendations for Outpatient Follow-up:  1. Continue coumadin for 9-12 mo 2. INR check at PCP office already arranged   Discharge Diagnoses:  Principal Problem:   PE (pulmonary embolism) Active Problems:   Nausea alone   Discharge Condition: good  Diet recommendation: low vit k diet  Filed Weights   04/16/13 2218  Weight: 54.7 kg (120 lb 9.5 oz)    History of present illness:  63 y/o ?, no significant pmh other than arthritis and on HRT admitted to the hospital 04/16/13 with acute pulmonary embolism. This was found after her being persistently short of breath, fatigue and she went to primary care physician's office had a workup and found to have PE and started on Xarelto. She'llfwasn't able to tolerate this and had excessive nausea and vomiting which was recalcitrant to Zofran and Compazine.  She was admitted for management of the need for anticoagulation on an agent that din;t make her nauseous and that she could tolerate She was initially placed on Lovenox to bridge to Coumadin and INr was monitored in hospital as Xarelto could have affected levels He rd/c INR was 1.16 She was d/c home on 1 weeks supply Coumadin 3 mg, ad instructed not to eat large variations in green vegetables. She was set up with OP dr. Eppie GibsonMetheny Monday 4/20 with INr check to determine further dosing of coumadin Given rx for 5 days Lovenox 80 mg and it was confirmed that she could pay for this     Discharge Exam: Filed Vitals:   04/18/13 0430  BP: 105/53  Pulse: 81  Temp: 97.4 F (36.3 C)  Resp: 18    General: well, alert pleasant no nv/cp Cardiovascular: s1 s2 no m/r/g Respiratory: clear  Discharge Instructions You were cared for by a hospitalist during your hospital stay. If you have any questions  about your discharge medications or the care you received while you were in the hospital after you are discharged, you can call the unit and asked to speak with the hospitalist on call if the hospitalist that took care of you is not available. Once you are discharged, your primary care physician will handle any further medical issues. Please note that NO REFILLS for any discharge medications will be authorized once you are discharged, as it is imperative that you return to your primary care physician (or establish a relationship with a primary care physician if you do not have one) for your aftercare needs so that they can reassess your need for medications and monitor your lab values.  Discharge Orders   Future Appointments Provider Department Dept Phone   04/20/2013 2:30 PM Agapito Gamesatherine D Metheney, MD [facility] [place] [phone]   Future Orders Complete By Expires   Diet - low sodium heart healthy  As directed    Discharge instructions  As directed    Increase activity slowly  As directed        Medication List    STOP taking these medications       Rivaroxaban 15 MG Tabs tablet  Commonly known as:  XARELTO      TAKE these medications       CALCIUM PO  Take 1,500 mg by mouth.     cetirizine 10 MG tablet  Commonly known as:  ZYRTEC  Take 10 mg by  mouth daily.     enoxaparin 80 MG/0.8ML injection  Commonly known as:  LOVENOX  Inject 0.8 mLs (80 mg total) into the skin daily.     gabapentin 300 MG capsule  Commonly known as:  NEURONTIN  Take 300 mg by mouth at bedtime.     hyoscyamine 0.125 MG tablet  Commonly known as:  LEVSIN, ANASPAZ  Take 1 tablet (0.125 mg total) by mouth every 4 (four) hours as needed for cramping.     metoprolol succinate 50 MG 24 hr tablet  Commonly known as:  TOPROL-XL  Take 1 tablet (50 mg total) by mouth daily. Take with or immediately following a meal.     montelukast 10 MG tablet  Commonly known as:  SINGULAIR   Take 10 mg by mouth every morning.     ondansetron 8 MG tablet  Commonly known as:  ZOFRAN  Take 8 mg by mouth every 8 (eight) hours as needed for nausea or vomiting.     prochlorperazine 10 MG tablet  Commonly known as:  COMPAZINE  Take 10 mg by mouth every 6 (six) hours as needed for nausea or vomiting.     ranitidine 150 MG capsule  Commonly known as:  ZANTAC  Take 150 mg by mouth daily.     VITAMIN D PO  Take 2,000 Units by mouth.     warfarin 3 MG tablet  Commonly known as:  COUMADIN  Take 1 tablet (3 mg total) by mouth daily.       Allergies  Allergen Reactions  . Morphine Anaphylaxis    Swelling   . Azithromycin Nausea And Vomiting  . Betadine [Povidone Iodine]   . Povidone-Iodine   . Promethazine Hcl        Follow-up Information   Follow up with METHENEY,CATHERINE, MD On 04/20/2013. (Appt made for 2:30 pm for Office Visit and INR check)    Specialty:  Family Medicine   Contact information:   [address] [place] [address] [phone]        The results of significant diagnostics from this hospitalization (including imaging, microbiology, ancillary and laboratory) are listed below for reference.    Significant Diagnostic Studies: Dg Chest 2 View  04/14/2013   CLINICAL DATA:  Shortness of breath.  Malaise and fatigue.  EXAM: CHEST  2 VIEW  COMPARISON:  None.  FINDINGS: The heart size and mediastinal contours are within normal limits. Both lungs are clear. No evidence pleural effusion. Mild thoracic dextroscoliosis noted. Fusion hardware noted in lower cervical spine.  IMPRESSION: No active cardiopulmonary disease.  Scoliosis.   Electronically Signed   By: Myles Rosenthal M.D.   On: 04/14/2013 16:29   Ct Angio Chest Pe W/cm &/or Wo Cm  04/15/2013   CLINICAL DATA:  Shortness of breath and chest pain  EXAM: CT ANGIOGRAPHY CHEST WITH CONTRAST  TECHNIQUE: Multidetector CT imaging of the chest was performed using the standard protocol during  bolus administration of intravenous contrast. Multiplanar CT image reconstructions and MIPs were obtained to evaluate the vascular anatomy.  CONTRAST:  OMNIPAQUE IOHEXOL 350 MG/ML SOLN  COMPARISON:  Chest CT March 26, 2004; chest radiograph April 14, 2013  FINDINGS: There is extensive pulmonary embolus in right lower lobe pulmonary artery branches. There are smaller pulmonary emboli in several left lower lobe pulmonary artery branches. There is no pulmonary embolus in the either main pulmonary outflow tract. There is no demonstrable right heart strain.  There is no thoracic  aortic aneurysm or dissection.  There is underlying centrilobular emphysema. There is a somewhat wedge-shaped area of opacity in the lateral right base, upright representing an infarct. There is some surrounding patchy opacity which may represent mild infiltrate is well. There is also some mild infiltrate as well as atelectasis in the left base. There is some bronchiectatic change in both upper and lower lobes.  There is no appreciable thoracic adenopathy. The pericardium is not thickened.  In the visualized upper abdomen, there is fatty liver. There are no blastic or lytic bone lesions. Visualized thyroid is normal.  Review of the MIP images confirms the above findings.  IMPRESSION: Multiple pulmonary emboli in both lower lobe pulmonary arteries, more on the right than on the left.  Question infarct lateral right base. There is also mild patchy infiltrate in the bases bilaterally, slightly more on the right than on the left.  Underlying emphysematous change with bronchiectatic change bilaterally, primarily in the lower lobes.  Fatty liver.  Critical Value/emergent results were communicated directly to Dr. Nani GasserATHERINE METHENEY , who verbally acknowledged these results.   Electronically Signed   By: Bretta BangWilliam  Woodruff M.D.   On: 04/15/2013 12:50   Mm Screening Breast Tomo Bilateral  04/06/2013   CLINICAL DATA:  Screening.  EXAM: DIGITAL  SCREENING BILATERAL MAMMOGRAM WITH 3D TOMO WITH CAD  COMPARISON:  Previous exam(s).  ACR Breast Density Category c: The breast tissue is heterogeneously dense, which may obscure small masses.  FINDINGS: There are no findings suspicious for malignancy. Images were processed with CAD.  IMPRESSION: No mammographic evidence of malignancy. A result letter of this screening mammogram will be mailed directly to the patient.  RECOMMENDATION: Screening mammogram in one year. (Code:SM-B-01Y)  BI-RADS CATEGORY  1: Negative.   Electronically Signed   By: Britta MccreedySusan  Turner M.D.   On: 04/06/2013 16:10    Microbiology: No results found for this or any previous visit (from the past 240 hour(s)).   Labs: Basic Metabolic Panel:  Recent Labs Lab 04/14/13 1602 04/16/13 1705 04/17/13 0339  NA 139 140 138  K 3.8 3.7 3.4*  CL 100 100 103  CO2 30 25 25   GLUCOSE 83 106* 88  BUN 8 5* 4*  CREATININE 0.67 0.70 0.68  CALCIUM 9.5 9.5 8.2*   Liver Function Tests:  Recent Labs Lab 04/14/13 1602  AST 18  ALT 9  ALKPHOS 60  BILITOT 0.3  PROT 7.3  ALBUMIN 4.2   No results found for this basename: LIPASE, AMYLASE,  in the last 168 hours No results found for this basename: AMMONIA,  in the last 168 hours CBC:  Recent Labs Lab 04/14/13 1602 04/16/13 1705 04/17/13 0339  WBC 7.4 8.3 7.5  NEUTROABS 4.8  --   --   HGB 12.6 13.5 11.6*  HCT 35.4* 39.5 34.2*  MCV 85.3 91.2 89.1  PLT 266 268 234   Cardiac Enzymes:  Recent Labs Lab 04/16/13 1705  TROPONINI <0.30   BNP: BNP (last 3 results) No results found for this basename: PROBNP,  in the last 8760 hours CBG: No results found for this basename: GLUCAP,  in the last 168 hours     Signed:  Rhetta MuraJai-Gurmukh Kathlean Cinco  [facility] 04/18/2013, 12:04 PM

## 2013-04-20 ENCOUNTER — Ambulatory Visit: Payer: Self-pay | Admitting: Family Medicine

## 2013-04-20 ENCOUNTER — Encounter: Payer: Self-pay | Admitting: Family Medicine

## 2013-04-20 ENCOUNTER — Ambulatory Visit (INDEPENDENT_AMBULATORY_CARE_PROVIDER_SITE_OTHER): Payer: BC Managed Care – PPO | Admitting: Family Medicine

## 2013-04-20 ENCOUNTER — Ambulatory Visit: Payer: BC Managed Care – PPO

## 2013-04-20 VITALS — BP 128/76 | HR 111 | Wt 118.0 lb

## 2013-04-20 DIAGNOSIS — I2699 Other pulmonary embolism without acute cor pulmonale: Secondary | ICD-10-CM

## 2013-04-20 DIAGNOSIS — R110 Nausea: Secondary | ICD-10-CM

## 2013-04-20 LAB — POCT INR: INR: 1.7

## 2013-04-20 MED ORDER — WARFARIN SODIUM 3 MG PO TABS: 3.0000 mg | ORAL_TABLET | Freq: Every day | ORAL | Status: DC

## 2013-04-20 MED ORDER — LORAZEPAM 1 MG PO TABS: 0.5000 mg | ORAL_TABLET | Freq: Two times a day (BID) | ORAL | Status: DC | PRN

## 2013-04-20 NOTE — Progress Notes (Signed)
Subjective:    Patient ID: Kathleen Garcia, female    DOB: 01/12/1950, 63 y.o.   MRN: [id number]  HPI Here today to followup for bilateral pulmonary embolism. I saw her last week and she was started on Xarelto. Unfortunately her nausea turn into severe nausea and vomiting. She went to the emergency department per her recommendation. At that point in time they stopped the Xarelto and switch her to Coumadin with Lovenox. She's here for followup today. She still feeling a little bit nauseated and fatigued. Has 3mg  of coumadin daily.  On lovenox daily but only 2 shots.  No fever.  Having some palps but still holding her metoprolol.  She has not had any fevers chills or sweats since then. She has had a little bit of shortness of breath with significant activity such as walking from the parking lot to our building. She has several questions that she's written down regarding her medications and the pulmonary embolisms. She also requests a prescription for Ativan. She says that was really the only thing that helped her nausea while she was in the hospital. The Compazine and Zofran were not helpful.  She's also noticed a somewhat of a fullness sensation in the epigastric area. She denies any pain. She does have a history of heartburn and typically takes Zantac. Review of Systems  BP 128/76  Pulse 111  Wt 118 lb (53.524 kg)  SpO2 97%    Allergies  Allergen Reactions  . Morphine Anaphylaxis    Swelling   . Azithromycin Nausea And Vomiting  . Betadine [Povidone Iodine]   . Povidone-Iodine   . Promethazine Hcl     Past Medical History  Diagnosis Date  . Palpitation   . Trigeminal neuralgia   . Chest pain   . Fluttering heart   . Irritable bowel syndrome   . Irregular heart rate   . Osteoporosis   . Diarrhea     The history was provided by the patient  . Vomiting     The history was provided by the patient  . Osteomyelitis     middle phalanx, right index finger  . Cervical spondylosis     with disk protrusion    Past Surgical History  Procedure Laterality Date  . Knee surgery    . Tonsillectomy    . Cervical disc surgery    . Tubal ligation    . Colonoscopy      with biopsies  . Upper gastrointestinal endoscopy       with biopsies    History   Social History  . Marital Status: Married    Spouse Name: N/A    Number of Children: N/A  . Years of Education: N/A   Occupational History  . Not on file.   Social History Main Topics  . Smoking status: Never Smoker   . Smokeless tobacco: Not on file  . Alcohol Use: No  . Drug Use: No  . Sexual Activity: No   Other Topics Concern  . Not on file   Social History Narrative  . No narrative on file    Family History  Problem Relation Age of Onset  . Hypertension Mother   . Coronary artery disease Father     Outpatient Encounter Prescriptions as of 04/20/2013  Medication Sig  . CALCIUM PO Take 1,500 mg by mouth.    . cetirizine (ZYRTEC) 10 MG tablet Take 10 mg by mouth daily.    . Cholecalciferol (VITAMIN D PO) Take 2,000  Units by mouth.   . enoxaparin (LOVENOX) 80 MG/0.8ML injection Inject 0.8 mLs (80 mg total) into the skin daily.  Marland Kitchen. gabapentin (NEURONTIN) 300 MG capsule Take 300 mg by mouth at bedtime.   . hyoscyamine (LEVSIN, ANASPAZ) 0.125 MG tablet Take 1 tablet (0.125 mg total) by mouth every 4 (four) hours as needed for cramping.  . metoprolol succinate (TOPROL-XL) 50 MG 24 hr tablet Take 1 tablet (50 mg total) by mouth daily. Take with or immediately following a meal.  . MIMVEY 1-0.5 MG per tablet   . montelukast (SINGULAIR) 10 MG tablet Take 10 mg by mouth every morning.   . ondansetron (ZOFRAN) 8 MG tablet Take 8 mg by mouth every 8 (eight) hours as needed for nausea or vomiting.  . prochlorperazine (COMPAZINE) 10 MG tablet Take 10 mg by mouth every 6 (six) hours as needed for nausea or vomiting.  . ranitidine (ZANTAC) 150 MG capsule Take 150 mg by mouth daily.    Marland Kitchen. warfarin (COUMADIN) 3 MG  tablet Take 1 tablet (3 mg total) by mouth daily.  . [DISCONTINUED] warfarin (COUMADIN) 3 MG tablet Take 1 tablet (3 mg total) by mouth daily.  Marland Kitchen. LORazepam (ATIVAN) 1 MG tablet Take 0.5-1 tablets (0.5-1 mg total) by mouth 2 (two) times daily as needed (nausea).          Objective:   Physical Exam  Constitutional: She is oriented to person, place, and time. She appears well-developed and well-nourished.  HENT:  Head: Normocephalic and atraumatic.  Cardiovascular: Normal rate, regular rhythm and normal heart sounds.   Pulmonary/Chest: Effort normal and breath sounds normal.  Neurological: She is alert and oriented to person, place, and time.  Skin: Skin is warm and dry.  Psychiatric: She has a normal mood and affect. Her behavior is normal.          Assessment & Plan:  Bilat PE - currently being treated with Lovenox and Coumadin. She has 2 more days worth of Lovenox and is currently taking 3 mg of warfarin. Refill prescription sent to pharmacy for warfarin. Discussed goal is to get to a therapeutic INR range of 2-3.  Recommend out for a week to rest and stay out of work. Then ok to go back on Monday.  Will give ativan for nausea relief.  F/U /Thursday or Friday next INR. Followup in 2 weeks with PCP. Expected length of therapy will be 6 months. Reviewed this with her. She has discontinued her hormone replacement therapy.  Nausea-under much better control. Did give her a short prescription for Ativan to use as needed since that's the most effective product so far for her nausea. Did warn about potential for dependency color and to avoid taking the product before driving.  GERD-continue Zantac.  Time spent 30 minutes, greater than 50% time spent counseling about bilateral pulmonary emboli and treatment, and nausea.

## 2013-04-23 ENCOUNTER — Ambulatory Visit (INDEPENDENT_AMBULATORY_CARE_PROVIDER_SITE_OTHER): Payer: BC Managed Care – PPO | Admitting: Sports Medicine

## 2013-04-23 ENCOUNTER — Encounter: Payer: Self-pay | Admitting: *Deleted

## 2013-04-23 VITALS — BP 107/64 | HR 93

## 2013-04-23 DIAGNOSIS — I2699 Other pulmonary embolism without acute cor pulmonale: Secondary | ICD-10-CM

## 2013-04-23 LAB — POCT INR: INR: 2.1

## 2013-04-23 NOTE — Progress Notes (Signed)
   [name] unfortunately had fairly extensive bilateral pulmonary emboli, she did not tolerate the newer anticoagulants and thus was switched to Coumadin.

## 2013-04-24 ENCOUNTER — Telehealth: Payer: Self-pay

## 2013-04-24 NOTE — Telephone Encounter (Signed)
Patient called wanted to know if it was ok to take Zyrtec with her taking Coumadin. She also wants to know what to take for Acid reflux? Rhonda Cunningham,CMA

## 2013-04-24 NOTE — Telephone Encounter (Signed)
Spoke w/Dr. T and he stated that pt can take zyrtec and zantac 300mg  BID. Pt informed.Kathleen Garcia

## 2013-04-27 ENCOUNTER — Telehealth: Payer: Self-pay

## 2013-04-27 ENCOUNTER — Encounter: Payer: Self-pay | Admitting: Sports Medicine

## 2013-04-27 NOTE — Telephone Encounter (Signed)
Patient called stated that she has now went back to work and she is doing better, she is requesting a note for work for 04/16- 04/24 explaining why she was out of work. And letter to be faxed to [facility]  [phone]. Rhonda Cunningham,CMA

## 2013-04-27 NOTE — Telephone Encounter (Signed)
Note is in my box, pls fax to [facility] at [phone]

## 2013-04-28 NOTE — Telephone Encounter (Signed)
[facility] @ [phone]. Rhonda Cunningham,CMA

## 2013-04-30 ENCOUNTER — Encounter: Payer: Self-pay | Admitting: Sports Medicine

## 2013-05-01 MED ORDER — DEXLANSOPRAZOLE 60 MG PO CPDR: 60.0000 mg | DELAYED_RELEASE_CAPSULE | Freq: Every day | ORAL | Status: DC

## 2013-05-04 ENCOUNTER — Ambulatory Visit: Payer: BC Managed Care – PPO | Admitting: Sports Medicine

## 2013-05-05 ENCOUNTER — Ambulatory Visit (INDEPENDENT_AMBULATORY_CARE_PROVIDER_SITE_OTHER): Payer: BC Managed Care – PPO | Admitting: Sports Medicine

## 2013-05-05 ENCOUNTER — Encounter: Payer: Self-pay | Admitting: Sports Medicine

## 2013-05-05 VITALS — BP 111/62 | HR 86 | Ht 62.0 in | Wt 120.0 lb

## 2013-05-05 DIAGNOSIS — I2699 Other pulmonary embolism without acute cor pulmonale: Secondary | ICD-10-CM

## 2013-05-05 DIAGNOSIS — R1013 Epigastric pain: Secondary | ICD-10-CM | POA: Insufficient documentation

## 2013-05-05 DIAGNOSIS — K219 Gastro-esophageal reflux disease without esophagitis: Secondary | ICD-10-CM

## 2013-05-05 NOTE — Assessment & Plan Note (Signed)
Continue Dexilant 

## 2013-05-05 NOTE — Progress Notes (Signed)
  Subjective:    CC: Followup  HPI: Pulmonary embolism: Extensive and bilateral, secondary to hormone replacement which has been discontinued. She had an adverse reaction to xarelto, currently doing Coumadin, most recent INR was 2.1, no bleeding, no melena or hematochezia. Shortness of breath has resolved.  Past medical history, Surgical history, Family history not pertinant except as noted below, Social history, Allergies, and medications have been entered into the medical record, reviewed, and no changes needed.   Review of Systems: No fevers, chills, night sweats, weight loss, chest pain, or shortness of breath.   Objective:    General: Well Developed, well nourished, and in no acute distress.  Neuro: Alert and oriented x3, extra-ocular muscles intact, sensation grossly intact.  HEENT: Normocephalic, atraumatic, pupils equal round reactive to light, neck supple, no masses, no lymphadenopathy, thyroid nonpalpable.  Skin: Warm and dry, no rashes. Cardiac: Regular rate and rhythm, no murmurs rubs or gallops, no lower extremity edema.  Respiratory: Clear to auscultation bilaterally. Not using accessory muscles, speaking in full sentences.  Impression and Recommendations:

## 2013-05-05 NOTE — Assessment & Plan Note (Signed)
Doing well, last INR was 2.1. Rechecking INR. She is essentially asymptomatic now with resolution of shortness of breath and chest pain. Duration of treatment will be 6 months, we can discontinue in November. Continue Tylenol as needed for pain, avoid NSAIDs, occasional Ativan for nausea and anxiety.

## 2013-05-06 LAB — PROTIME-INR
INR: 2.6 — ABNORMAL HIGH (ref <1.50)
Prothrombin Time: 27.2 seconds — ABNORMAL HIGH (ref 11.6–15.2)

## 2013-05-07 ENCOUNTER — Encounter: Payer: Self-pay | Admitting: Sports Medicine

## 2013-05-07 DIAGNOSIS — R51 Headache: Secondary | ICD-10-CM

## 2013-05-08 ENCOUNTER — Other Ambulatory Visit: Payer: Self-pay

## 2013-05-08 ENCOUNTER — Telehealth: Payer: Self-pay | Admitting: *Deleted

## 2013-05-08 MED ORDER — TRAMADOL HCL 50 MG PO TABS: ORAL_TABLET | ORAL | Status: DC

## 2013-05-08 NOTE — Telephone Encounter (Signed)
PA obtained for CT Head w/o contrast. Auth# [id number].   Spoke with pt informing her that it was authorized. Pt states that her headache is better since she used the Flonase and pain med. She states if headache gets bad over weekend she will go to ED. Also states that [name] in imaging told her since she doesn't want to do scan today to call Monday am if she wants to come and get it. Pt informed of possible complications with headaches and being new on an anti-coag medication. Pt verbalized understanding.  Kathleen CoryMisty Salsabeel Gorelick, LPN

## 2013-05-08 NOTE — Addendum Note (Signed)
Addended by: Monica BectonHEKKEKANDAM, Kashena Novitski J on: 05/08/2013 03:01 PM   Modules accepted: Orders

## 2013-05-08 NOTE — Telephone Encounter (Signed)
Headache in a patient on Coumadin, likely represents sinus headache considering her left frontal symptoms with radiation to the teeth, unfortunately we do need to check a CT head to ensure no intracranial process considering anti-coagulation. She will also take Zyrtec, and Flonase.

## 2013-06-02 ENCOUNTER — Ambulatory Visit (INDEPENDENT_AMBULATORY_CARE_PROVIDER_SITE_OTHER): Payer: BC Managed Care – PPO | Admitting: Sports Medicine

## 2013-06-02 ENCOUNTER — Ambulatory Visit: Payer: Self-pay | Admitting: Sports Medicine

## 2013-06-02 ENCOUNTER — Encounter: Payer: Self-pay | Admitting: Sports Medicine

## 2013-06-02 VITALS — BP 121/61 | HR 87 | Ht 61.0 in | Wt 121.0 lb

## 2013-06-02 DIAGNOSIS — M25511 Pain in right shoulder: Secondary | ICD-10-CM | POA: Insufficient documentation

## 2013-06-02 DIAGNOSIS — M25519 Pain in unspecified shoulder: Secondary | ICD-10-CM

## 2013-06-02 DIAGNOSIS — I2699 Other pulmonary embolism without acute cor pulmonale: Secondary | ICD-10-CM

## 2013-06-02 DIAGNOSIS — K219 Gastro-esophageal reflux disease without esophagitis: Secondary | ICD-10-CM

## 2013-06-02 LAB — POCT INR: INR: 1.7

## 2013-06-02 NOTE — Assessment & Plan Note (Signed)
Slightly subtherapeutic today. Increasing to additional 4.5 mg on one day. Continue anticoagulation until November of 2015.

## 2013-06-02 NOTE — Assessment & Plan Note (Addendum)
Clinically no rotator cuff, glenohumeral, or biceps tendon signs. This likely does represent an area of fat necrosis over the lateral humerus. Guided injection. Strapped with compressive dressing. Return to see me in a month.

## 2013-06-02 NOTE — Assessment & Plan Note (Signed)
Resolved with current medications 

## 2013-06-02 NOTE — Progress Notes (Signed)
  Subjective:    CC: Followup and right shoulder pain  HPI: This is a pleasant 63 year old female, for the past couple weeks she's had pain which she localizes over the lateral mid humerus at the deltoid insertion, worse with abduction but not worse with overhead activities. Pain is moderate, persistent. No radiation. No trauma. No constitutional symptoms.  Pulmonary embolism: Intolerant to the newer anticoagulant, currently on Coumadin, with plans for six-month treatment until November of 2015. Her INR today is 1.7, she did recently start a proton pump inhibitor. No changes in diet. Refer to breath, bleeding of the gums, melena, hematochezia, nausea, vomiting.  Past medical history, Surgical history, Family history not pertinant except as noted below, Social history, Allergies, and medications have been entered into the medical record, reviewed, and no changes needed.   Review of Systems: No fevers, chills, night sweats, weight loss, chest pain, or shortness of breath.   Objective:    General: Well Developed, well nourished, and in no acute distress.  Neuro: Alert and oriented x3, extra-ocular muscles intact, sensation grossly intact.  HEENT: Normocephalic, atraumatic, pupils equal round reactive to light, neck supple, no masses, no lymphadenopathy, thyroid nonpalpable.  Skin: Warm and dry, no rashes. Cardiac: Regular rate and rhythm, no murmurs rubs or gallops, no lower extremity edema.  Respiratory: Clear to auscultation bilaterally. Not using accessory muscles, speaking in full sentences. Right Shoulder: Inspection reveals no abnormalities, atrophy or asymmetry. Palpation is normal with no tenderness over AC joint or bicipital groove. Tender to palpation of the deltoid insertion. ROM is full in all planes. Rotator cuff strength normal throughout. No signs of impingement with negative Neer and Hawkin's tests, empty can sign. Speeds and Yergason's tests normal. No labral pathology  noted with negative Obrien's, negative clunk and good stability. Normal scapular function observed. No painful arc and no drop arm sign. No apprehension sign  Procedure: Real-time Ultrasound Guided Injection of right deltoid trigger point Device: GE Logiq E  Verbal informed consent obtained.  Time-out conducted.  Noted no overlying erythema, induration, or other signs of local infection.  Skin prepped in a sterile fashion.  Local anesthesia: Topical Ethyl chloride.  With sterile technique and under real time ultrasound guidance:  A total of 1 cc Kenalog 40, 2 cc lidocaine injected in a fanlike pattern around the deltoid insertion. Completed without difficulty  Pain immediately resolved suggesting accurate placement of the medication.  Advised to call if fevers/chills, erythema, induration, drainage, or persistent bleeding.  Images permanently stored and available for review in the ultrasound unit.  Impression: Technically successful ultrasound guided injection.  The arm was strapped with compressive dressing.  Impression and Recommendations:

## 2013-06-10 ENCOUNTER — Other Ambulatory Visit: Payer: Self-pay | Admitting: Family Medicine

## 2013-06-11 ENCOUNTER — Other Ambulatory Visit: Payer: Self-pay | Admitting: Sports Medicine

## 2013-06-11 DIAGNOSIS — M8580 Other specified disorders of bone density and structure, unspecified site: Secondary | ICD-10-CM | POA: Insufficient documentation

## 2013-06-16 ENCOUNTER — Ambulatory Visit (INDEPENDENT_AMBULATORY_CARE_PROVIDER_SITE_OTHER): Payer: BC Managed Care – PPO | Admitting: Sports Medicine

## 2013-06-16 DIAGNOSIS — I2699 Other pulmonary embolism without acute cor pulmonale: Secondary | ICD-10-CM

## 2013-06-16 LAB — POCT INR: INR: 2.1

## 2013-06-25 ENCOUNTER — Other Ambulatory Visit: Payer: Self-pay | Admitting: Gynecology

## 2013-06-26 LAB — CYTOLOGY - PAP

## 2013-06-29 ENCOUNTER — Other Ambulatory Visit: Payer: Self-pay | Admitting: Dermatology

## 2013-07-07 ENCOUNTER — Other Ambulatory Visit: Payer: Self-pay | Admitting: Sports Medicine

## 2013-07-07 MED ORDER — MONTELUKAST SODIUM 10 MG PO TABS: 10.0000 mg | ORAL_TABLET | Freq: Every morning | ORAL | Status: DC

## 2013-07-07 MED ORDER — WARFARIN SODIUM 3 MG PO TABS: ORAL_TABLET | ORAL | Status: DC

## 2013-07-07 MED ORDER — DEXLANSOPRAZOLE 60 MG PO CPDR: 60.0000 mg | DELAYED_RELEASE_CAPSULE | Freq: Every day | ORAL | Status: DC

## 2013-07-07 MED ORDER — RANITIDINE HCL 150 MG PO CAPS
ORAL_CAPSULE | ORAL | Status: DC
Start: 2013-07-07 — End: 2015-02-11

## 2013-07-09 ENCOUNTER — Telehealth: Payer: Self-pay | Admitting: *Deleted

## 2013-07-09 ENCOUNTER — Other Ambulatory Visit: Payer: Self-pay

## 2013-07-09 MED ORDER — WARFARIN SODIUM 3 MG PO TABS
ORAL_TABLET | ORAL | Status: DC
Start: 2013-07-09 — End: 2013-09-29

## 2013-07-09 NOTE — Telephone Encounter (Signed)
Dexilant approval good thru 07/10/2014. ZOXW#[id number]#[id number]. LMOM for pt to p/u med and spoke with Selena BattenKim @ [facility]. Corliss SkainsJamie Janara Klett, CMA

## 2013-07-17 ENCOUNTER — Ambulatory Visit (INDEPENDENT_AMBULATORY_CARE_PROVIDER_SITE_OTHER): Payer: BC Managed Care – PPO | Admitting: Sports Medicine

## 2013-07-17 VITALS — BP 107/66 | HR 80 | Temp 98.2°F | Ht 61.0 in | Wt 120.0 lb

## 2013-07-17 DIAGNOSIS — I2699 Other pulmonary embolism without acute cor pulmonale: Secondary | ICD-10-CM

## 2013-07-17 LAB — POCT INR: INR: 2.2

## 2013-07-20 NOTE — Telephone Encounter (Signed)
Pt. Informed about INR and direct with understanding

## 2013-08-07 ENCOUNTER — Ambulatory Visit: Payer: BC Managed Care – PPO | Admitting: Family Medicine

## 2013-08-17 ENCOUNTER — Other Ambulatory Visit: Payer: Self-pay | Admitting: Sports Medicine

## 2013-08-17 ENCOUNTER — Ambulatory Visit (INDEPENDENT_AMBULATORY_CARE_PROVIDER_SITE_OTHER): Payer: BC Managed Care – PPO | Admitting: Sports Medicine

## 2013-08-17 DIAGNOSIS — I2699 Other pulmonary embolism without acute cor pulmonale: Secondary | ICD-10-CM

## 2013-08-17 LAB — PROTIME-INR
INR: 3.42 — ABNORMAL HIGH (ref <1.50)
Prothrombin Time: 34.7 s — ABNORMAL HIGH (ref 11.6–15.2)

## 2013-08-17 LAB — POCT INR: INR: 4.3

## 2013-08-17 NOTE — Progress Notes (Signed)
   Subjective:    Patient ID: Kathleen Garcia, female    DOB: 02/26/1950, 63 y.o.   MRN: [id number]  HPI Hazelee advised of the change in dose. She will return in two weeks.   Review of Systems     Objective:   Physical Exam        Assessment & Plan:

## 2013-08-31 ENCOUNTER — Ambulatory Visit (INDEPENDENT_AMBULATORY_CARE_PROVIDER_SITE_OTHER): Payer: BC Managed Care – PPO | Admitting: Sports Medicine

## 2013-08-31 DIAGNOSIS — I2699 Other pulmonary embolism without acute cor pulmonale: Secondary | ICD-10-CM | POA: Diagnosis not present

## 2013-08-31 LAB — POCT INR: INR: 2.3

## 2013-08-31 NOTE — Progress Notes (Signed)
Left message of results and advised patient to return call.

## 2013-08-31 NOTE — Progress Notes (Signed)
Patient advised of recommendations and scheduled for next INR check.

## 2013-09-08 ENCOUNTER — Encounter: Payer: Self-pay | Admitting: Sports Medicine

## 2013-09-10 MED ORDER — DICLOFENAC SODIUM 2 % TD SOLN: 2.0000 | Freq: Two times a day (BID) | TRANSDERMAL | Status: DC

## 2013-09-28 ENCOUNTER — Ambulatory Visit: Payer: BC Managed Care – PPO

## 2013-09-28 ENCOUNTER — Telehealth: Payer: Self-pay | Admitting: Emergency Medicine

## 2013-09-29 ENCOUNTER — Ambulatory Visit (INDEPENDENT_AMBULATORY_CARE_PROVIDER_SITE_OTHER): Payer: BC Managed Care – PPO | Admitting: Sports Medicine

## 2013-09-29 ENCOUNTER — Other Ambulatory Visit: Payer: Self-pay | Admitting: Family Medicine

## 2013-09-29 ENCOUNTER — Encounter: Payer: Self-pay | Admitting: Sports Medicine

## 2013-09-29 DIAGNOSIS — I2699 Other pulmonary embolism without acute cor pulmonale: Secondary | ICD-10-CM

## 2013-09-29 LAB — POCT INR: INR: 1.9

## 2013-09-29 NOTE — Progress Notes (Signed)
Left detailed message.   

## 2013-09-30 ENCOUNTER — Telehealth: Payer: Self-pay

## 2013-09-30 NOTE — Telephone Encounter (Signed)
Probably best to try one medicine at a time, increase to twice a day for a week if needed then 3 times a day for a week if needed, then can increase again if needed to 600 mg 3 times a day.

## 2013-09-30 NOTE — Telephone Encounter (Signed)
Kathleen Garcia reports an increase in hot flashes and nausea. She has had them since she stopped the hormone replacement. She would like a recommendation for the hot flashes and nausea.

## 2013-09-30 NOTE — Telephone Encounter (Signed)
She takes 300 mg of gabapentin at bed time. She is ok with increasing the gabapentin. She wants to know if just increasing the gabapentin would help or will she need to try one of the SSRI's.

## 2013-09-30 NOTE — Telephone Encounter (Signed)
We have several options including SSRIs (Celexa, Zoloft, Lexapro, Prozac), and gabapentin. She does have gabapentin on her chart, as she tried this yet? I would also like her to get some over-the-counter black cohosh.

## 2013-09-30 NOTE — Progress Notes (Signed)
Patient advised or recommendations.  °

## 2013-10-01 NOTE — Telephone Encounter (Signed)
Patient advised of recommendations. She reports that she increased her gabapentin to 500 mg's last night. She has had only one hot flash today.

## 2013-10-14 ENCOUNTER — Ambulatory Visit (INDEPENDENT_AMBULATORY_CARE_PROVIDER_SITE_OTHER): Payer: BC Managed Care – PPO | Admitting: Family Medicine

## 2013-10-14 VITALS — BP 97/54 | HR 74

## 2013-10-14 DIAGNOSIS — I2699 Other pulmonary embolism without acute cor pulmonale: Secondary | ICD-10-CM

## 2013-10-14 LAB — POCT INR: INR: 2.2

## 2013-10-14 NOTE — Progress Notes (Signed)
Patient advised of results and recommendations.  

## 2013-10-14 NOTE — Progress Notes (Signed)
Kathleen Garcia, Will you please let patient know that her INR was therapeutic.  No change to coumadin regimen. Repeat one week to ensure stability.

## 2013-10-14 NOTE — Progress Notes (Signed)
Pt.notified

## 2013-10-15 ENCOUNTER — Other Ambulatory Visit: Payer: Self-pay | Admitting: Family Medicine

## 2013-10-16 ENCOUNTER — Other Ambulatory Visit: Payer: Self-pay | Admitting: *Deleted

## 2013-10-16 MED ORDER — GABAPENTIN 300 MG PO CAPS: 300.0000 mg | ORAL_CAPSULE | Freq: Every day | ORAL | Status: DC

## 2013-10-22 ENCOUNTER — Other Ambulatory Visit: Payer: Self-pay | Admitting: Sports Medicine

## 2013-10-26 ENCOUNTER — Ambulatory Visit (INDEPENDENT_AMBULATORY_CARE_PROVIDER_SITE_OTHER): Payer: BC Managed Care – PPO | Admitting: Sports Medicine

## 2013-10-26 ENCOUNTER — Encounter: Payer: Self-pay | Admitting: Sports Medicine

## 2013-10-26 VITALS — BP 105/65 | HR 85 | Ht 61.0 in | Wt 123.0 lb

## 2013-10-26 DIAGNOSIS — R55 Syncope and collapse: Secondary | ICD-10-CM

## 2013-10-26 DIAGNOSIS — I2699 Other pulmonary embolism without acute cor pulmonale: Secondary | ICD-10-CM

## 2013-10-26 MED ORDER — SERTRALINE HCL 50 MG PO TABS: 50.0000 mg | ORAL_TABLET | Freq: Every day | ORAL | Status: DC

## 2013-10-26 NOTE — Progress Notes (Signed)
  Subjective:    CC: Follow-up  HPI: Hot flashes: Menopause at 63 years old, since then she is had occasional hot flashes, well controlled initially with hormone replacement but unfortunately had a embolism. More recently she has been on gabapentin which she thinks has been somewhat effective, she is doing 100 mg in the morning and 300 mg at bedtime. She is amenable to increasing the dose. She tried black cohosh without any improvement.  Pulmonary embolism: Currently on Coumadin, therapeutic a week and a half ago, she has until the end of next month before she can come off. She does desire a repeat CT angiogram to assess her clot burden, she is asymptomatic.  Past medical history, Surgical history, Family history not pertinant except as noted below, Social history, Allergies, and medications have been entered into the medical record, reviewed, and no changes needed.   Review of Systems: No fevers, chills, night sweats, weight loss, chest pain, or shortness of breath.   Objective:    General: Well Developed, well nourished, and in no acute distress.  Neuro: Alert and oriented x3, extra-ocular muscles intact, sensation grossly intact.  HEENT: Normocephalic, atraumatic, pupils equal round reactive to light, neck supple, no masses, no lymphadenopathy, thyroid nonpalpable.  Skin: Warm and dry, no rashes. Cardiac: Regular rate and rhythm, no murmurs rubs or gallops, no lower extremity edema.  Respiratory: Clear to auscultation bilaterally. Not using accessory muscles, speaking in full sentences.  Impression and Recommendations:

## 2013-10-26 NOTE — Assessment & Plan Note (Signed)
Occasional hot flashes with nausea related to postmenopausal state. Continue gabapentin 100 mg at breakfast and lunch and 300 mg at dinner. Continue Ativan as needed for nausea. I am going to add a prescription for Zoloft which she can fill if the gabapentin is ineffective for controlling her vasomotor instability.

## 2013-10-26 NOTE — Assessment & Plan Note (Signed)
Continue Coumadin until the end of November. Return to see me early December after we discontinue Coumadin. She does desire repeat CT angiogram, explained this may be unnecessary, I'm still happy to order this test.

## 2013-11-02 ENCOUNTER — Telehealth: Payer: Self-pay | Admitting: *Deleted

## 2013-11-02 NOTE — Telephone Encounter (Signed)
Dexilant approval [id number] good thru 10/03/13-11/02/14. Pharmacy and patient notified. Corliss SkainsJamie Suhail Peloquin, RMA

## 2013-11-11 ENCOUNTER — Ambulatory Visit: Payer: BC Managed Care – PPO

## 2013-11-12 ENCOUNTER — Ambulatory Visit (INDEPENDENT_AMBULATORY_CARE_PROVIDER_SITE_OTHER): Payer: BC Managed Care – PPO | Admitting: Sports Medicine

## 2013-11-12 VITALS — BP 101/57 | HR 83

## 2013-11-12 DIAGNOSIS — I2699 Other pulmonary embolism without acute cor pulmonale: Secondary | ICD-10-CM

## 2013-11-12 LAB — POCT INR: INR: 1.8

## 2013-11-13 NOTE — Progress Notes (Signed)
Left message stating recommendations and for a return call.

## 2013-11-25 ENCOUNTER — Ambulatory Visit (INDEPENDENT_AMBULATORY_CARE_PROVIDER_SITE_OTHER): Payer: BC Managed Care – PPO | Admitting: Sports Medicine

## 2013-11-25 VITALS — BP 118/67 | HR 83 | Wt 121.0 lb

## 2013-11-25 DIAGNOSIS — I2699 Other pulmonary embolism without acute cor pulmonale: Secondary | ICD-10-CM

## 2013-11-25 LAB — POCT INR: INR: 2.6

## 2013-11-25 NOTE — Progress Notes (Signed)
Delona aware of plan of care and will f/u for pt/inr recheck. Corliss SkainsJamie Makita Blow, CMA

## 2013-11-30 ENCOUNTER — Telehealth: Payer: Self-pay | Admitting: *Deleted

## 2013-11-30 NOTE — Telephone Encounter (Signed)
Kathleen Garcia calls asking about her coumadin. She said that for about the last month she has been experiencing hand, neck and ankle joint pain that is increasing in severity to the point she has taken some pain medicine. She read that this could be a side effect from the coumadin. Is this possible? Please advise. Corliss SkainsJamie Zachari Alberta, CMA

## 2013-11-30 NOTE — Telephone Encounter (Signed)
In out earlier conversation she said you had told her she would only be on medication for 6 months and she said that if this be the case she would like to be weaned off as soon as medically stable. Corliss SkainsJamie Anayah Arvanitis, CMA

## 2013-11-30 NOTE — Telephone Encounter (Signed)
It certainly can be, we have tried one of the new or anticoagulants in the past and she had an adverse reaction, would she like to try a different one? I would certainly recommend she try one of the newer anticoagulants rather than Coumadin.

## 2013-11-30 NOTE — Telephone Encounter (Signed)
Wow, 6 months has come so quickly, she can go ahead and just simply stop the Coumadin.

## 2013-12-01 NOTE — Telephone Encounter (Signed)
Voicemail left for Orchid to stop taking the coumadin. Corliss SkainsJamie Ellise Kovack, CMA

## 2013-12-02 ENCOUNTER — Telehealth: Payer: Self-pay

## 2013-12-02 MED ORDER — FLUTICASONE PROPIONATE 50 MCG/ACT NA SUSP: NASAL | Status: AC

## 2013-12-02 NOTE — Telephone Encounter (Signed)
Calling in MaxFlonase and over-the-counter DayQuil, at her appointment we will determine if antibiotics are necessary.

## 2013-12-02 NOTE — Telephone Encounter (Signed)
patient called stated that she is having flu like symptoms that stated Monday afternoon. Patient is having clear like drainage, low grade fever, body aches and voice change, no headaches but she do report feeling bubble head. She is currently taking Sudafed and Delysum. She stated that she do not do well with antibiotics unless it is a short course.  She has scheduled an appt for 2:00 pm to be seen but if  She can get something sent to her pharmacy she would appreciate it. Addis Tuohy,CMA

## 2013-12-03 ENCOUNTER — Encounter: Payer: Self-pay | Admitting: Sports Medicine

## 2013-12-03 ENCOUNTER — Ambulatory Visit (INDEPENDENT_AMBULATORY_CARE_PROVIDER_SITE_OTHER): Payer: BC Managed Care – PPO | Admitting: Sports Medicine

## 2013-12-03 VITALS — BP 116/67 | HR 85 | Temp 98.0°F | Wt 124.0 lb

## 2013-12-03 DIAGNOSIS — I2699 Other pulmonary embolism without acute cor pulmonale: Secondary | ICD-10-CM

## 2013-12-03 DIAGNOSIS — R059 Cough, unspecified: Secondary | ICD-10-CM | POA: Insufficient documentation

## 2013-12-03 DIAGNOSIS — J040 Acute laryngitis: Secondary | ICD-10-CM | POA: Diagnosis not present

## 2013-12-03 DIAGNOSIS — R05 Cough: Secondary | ICD-10-CM | POA: Insufficient documentation

## 2013-12-03 NOTE — Assessment & Plan Note (Signed)
Improving with Flonase and over-the-counter cold and flu medication. Return as needed for this.

## 2013-12-03 NOTE — Progress Notes (Signed)
  Subjective:    CC: Sick  HPI: Kathleen Garcia is exquisitely pleasant 63 year old female who comes in with a several day history of sore throat, nasal drainage, and now hoarseness in voice, there is also mild cough with essentially no sore throat, she also has some occasional body aches. Symptoms are mild, improving, we added Flonase and over-the-counter cold and flu medications over the phone, and she tells me she has improved significantly.  Pulmonary embolism: Has finished 6 months of anticoagulation with warfarin, she is eager to get off of the medication. No chest pain or shortness of breath. She does desire that we do a repeat CT angiogram.  Past medical history, Surgical history, Family history not pertinant except as noted below, Social history, Allergies, and medications have been entered into the medical record, reviewed, and no changes needed.   Review of Systems: No fevers, chills, night sweats, weight loss, chest pain, or shortness of breath.   Objective:    General: Well Developed, well nourished, and in no acute distress.  Neuro: Alert and oriented x3, extra-ocular muscles intact, sensation grossly intact.  HEENT: Normocephalic, atraumatic, pupils equal round reactive to light, neck supple, no masses, no lymphadenopathy, thyroid nonpalpable. Oropharynx, nasopharynx, external ear canals are unremarkable. Skin: Warm and dry, no rashes. Cardiac: Regular rate and rhythm, no murmurs rubs or gallops, no lower extremity edema.  Respiratory: Clear to auscultation bilaterally. Not using accessory muscles, speaking in full sentences.  Impression and Recommendations:

## 2013-12-03 NOTE — Assessment & Plan Note (Signed)
Patient has completed over 6 months of warfarin for single episode pulmonary embolism. Discontinue warfarin today, she does request repeat CT angiogram. Checking metabolic panel in anticipation.

## 2013-12-03 NOTE — Telephone Encounter (Signed)
Spoke to patient gave her instructions as noted below. Kathleen Garcia,CMA  

## 2013-12-03 NOTE — Patient Instructions (Signed)
Laryngitis °At the top of your windpipe is your voice box. It is the source of your voice. Inside your voice box are 2 bands of muscles called vocal cords. When you breathe, your vocal cords are relaxed and open so that air can get into the lungs. When you decide to say something, these cords come together and vibrate. The sound from these vibrations goes into your throat and comes out through your mouth as sound.  °Laryngitis is an inflammation of the vocal cords that causes hoarseness, cough, loss of voice, sore throat, and dry throat. Laryngitis can be temporary (acute) or Bee-term (chronic). Most cases of acute laryngitis improve with time.Chronic laryngitis lasts for more than 3 weeks. °CAUSES °Laryngitis can often be related to excessive smoking, talking, or yelling, as well as inhalation of toxic fumes and allergies. Acute laryngitis is usually caused by a viral infection, vocal strain, measles or mumps, or bacterial infections. Chronic laryngitis is usually caused by vocal cord strain, vocal cord injury, postnasal drip, growths on the vocal cords, or acid reflux. °SYMPTOMS  °· Cough. °· Sore throat. °· Dry throat. °RISK FACTORS °· Respiratory infections. °· Exposure to irritating substances, such as cigarette smoke, excessive amounts of alcohol, stomach acids, and workplace chemicals. °· Voice trauma, such as vocal cord injury from shouting or speaking too loud. °DIAGNOSIS  °Your cargiver will perform a physical exam. During the physical exam, your caregiver will examine your throat. The most common sign of laryngitis is hoarseness. Laryngoscopy may be necessary to confirm the diagnosis of this condition. This procedure allows your caregiver to look into the larynx. °HOME CARE INSTRUCTIONS °· Drink enough fluids to keep your urine clear or pale yellow. °· Rest until you no longer have symptoms or as directed by your caregiver. °· Breathe in moist air. °· Take all medicine as directed by your  caregiver. °· Do not smoke. °· Talk as little as possible (this includes whispering). °· Write on paper instead of talking until your voice is back to normal. °· Follow up with your caregiver if your condition has not improved after 10 days. °SEEK MEDICAL CARE IF:  °· You have trouble breathing. °· You cough up blood. °· You have persistent fever. °· You have increasing pain. °· You have difficulty swallowing. °MAKE SURE YOU: °· Understand these instructions. °· Will watch your condition. °· Will get help right away if you are not doing well or get worse. °Document Released: 12/18/2004 Document Revised: 03/12/2011 Document Reviewed: 02/23/2010 °ExitCare® Patient Information ©2015 [employer]. This information is not intended to replace advice given to you by your health care provider. Make sure you discuss any questions you have with your health care provider. ° °

## 2013-12-07 ENCOUNTER — Telehealth: Payer: Self-pay

## 2013-12-07 MED ORDER — BENZONATATE 200 MG PO CAPS: 200.0000 mg | ORAL_CAPSULE | Freq: Three times a day (TID) | ORAL | Status: DC | PRN

## 2013-12-07 NOTE — Telephone Encounter (Signed)
Patient called stated that now has a cough and she would like a rx for Tessalon pearls sent to her pharmacy. Rhonda Cunningham,CMA

## 2013-12-07 NOTE — Telephone Encounter (Signed)
Done

## 2013-12-08 ENCOUNTER — Encounter: Payer: Self-pay | Admitting: Sports Medicine

## 2013-12-08 ENCOUNTER — Ambulatory Visit: Payer: BC Managed Care – PPO | Admitting: Sports Medicine

## 2013-12-09 ENCOUNTER — Telehealth: Payer: Self-pay | Admitting: *Deleted

## 2013-12-09 NOTE — Telephone Encounter (Signed)
Ct prior Berkley Harveyauth [id number] expires 01/07/14. Radiology approved and patient. Corliss SkainsJamie Hooria Gasparini, CMA

## 2013-12-15 ENCOUNTER — Telehealth: Payer: Self-pay

## 2013-12-15 MED ORDER — HYDROCOD POLST-CHLORPHEN POLST 10-8 MG/5ML PO LQCR: 5.0000 mL | Freq: Two times a day (BID) | ORAL | Status: DC | PRN

## 2013-12-15 NOTE — Telephone Encounter (Signed)
Patient called stated that she cannot shake the cough off she stated that she has completed previous medication and she would like a Rx for Tussinex (to help her sleep at night) sent to [facility].Please advise the only symptom she have is cough. Kajah Santizo,CMA

## 2013-12-15 NOTE — Telephone Encounter (Signed)
Spoke to patient advised her Rx was ready for pickup. Marzelle Rutten,CMA

## 2013-12-15 NOTE — Telephone Encounter (Signed)
Prescription in box for Tussionex, she will probably need to pick this prescription up

## 2013-12-16 ENCOUNTER — Encounter: Payer: Self-pay | Admitting: Sports Medicine

## 2013-12-18 ENCOUNTER — Other Ambulatory Visit: Payer: Self-pay | Admitting: Sports Medicine

## 2013-12-22 LAB — BASIC METABOLIC PANEL
CO2: 32 mEq/L (ref 19–32)
Calcium: 9.2 mg/dL (ref 8.4–10.5)
Chloride: 102 mEq/L (ref 96–112)
Creat: 0.75 mg/dL (ref 0.50–1.10)
Sodium: 140 mEq/L (ref 135–145)

## 2013-12-22 LAB — BASIC METABOLIC PANEL WITH GFR
BUN: 9 mg/dL (ref 6–23)
Glucose, Bld: 101 mg/dL — ABNORMAL HIGH (ref 70–99)
Potassium: 3.9 meq/L (ref 3.5–5.3)

## 2013-12-26 ENCOUNTER — Other Ambulatory Visit: Payer: Self-pay | Admitting: Family Medicine

## 2013-12-30 ENCOUNTER — Ambulatory Visit (INDEPENDENT_AMBULATORY_CARE_PROVIDER_SITE_OTHER): Payer: BC Managed Care – PPO

## 2013-12-30 DIAGNOSIS — I2699 Other pulmonary embolism without acute cor pulmonale: Secondary | ICD-10-CM

## 2013-12-30 DIAGNOSIS — Z86711 Personal history of pulmonary embolism: Secondary | ICD-10-CM

## 2013-12-30 MED ORDER — IOHEXOL 350 MG/ML SOLN: 100.0000 mL | Freq: Once | INTRAVENOUS | Status: AC | PRN

## 2014-01-14 ENCOUNTER — Encounter: Payer: Self-pay | Admitting: Sports Medicine

## 2014-01-28 ENCOUNTER — Telehealth: Payer: Self-pay

## 2014-01-28 NOTE — Telephone Encounter (Signed)
Spoke to patient gave her information as noted below. Amyra Vantuyl,CMA  

## 2014-01-28 NOTE — Telephone Encounter (Signed)
Yes, nurse visit for urinalysis and urine culture tomo, will treat depending on results.

## 2014-01-28 NOTE — Telephone Encounter (Signed)
Patient called stated that she has had constant urges to urinate  and it hurts when she do go. Patient stated that she is taking Pyridum and she wants to know if she can drop a urine specimen off tomorrow during her planning period. Please advise patient on what she should do. Kathleen Garcia,CMA

## 2014-01-29 ENCOUNTER — Ambulatory Visit (INDEPENDENT_AMBULATORY_CARE_PROVIDER_SITE_OTHER): Payer: BC Managed Care – PPO | Admitting: Sports Medicine

## 2014-01-29 DIAGNOSIS — R350 Frequency of micturition: Secondary | ICD-10-CM

## 2014-01-29 DIAGNOSIS — R300 Dysuria: Secondary | ICD-10-CM | POA: Diagnosis not present

## 2014-01-29 DIAGNOSIS — N3090 Cystitis, unspecified without hematuria: Secondary | ICD-10-CM | POA: Insufficient documentation

## 2014-01-29 MED ORDER — FLUCONAZOLE 150 MG PO TABS: 150.0000 mg | ORAL_TABLET | Freq: Once | ORAL | Status: DC

## 2014-01-29 MED ORDER — SULFAMETHOXAZOLE-TRIMETHOPRIM 800-160 MG PO TABS: 1.0000 | ORAL_TABLET | Freq: Two times a day (BID) | ORAL | Status: DC

## 2014-01-29 NOTE — Assessment & Plan Note (Signed)
Urine was sent downstairs for urinalysis and urine culture, as well as micral. Adding Septra, Diflucan.

## 2014-01-29 NOTE — Progress Notes (Signed)
  Patient was seen for a nurse visit,urinalysis was unable to be run on our point-of-care machine and was sent downstairs. I did discuss some of the symptoms with the patient, she has no constitutional symptoms, no flank pain, simple dysuria with urgency. She has been taking Pyridium.

## 2014-01-30 LAB — URINALYSIS, ROUTINE W REFLEX MICROSCOPIC
Glucose, UA: NEGATIVE mg/dL
Hgb urine dipstick: NEGATIVE
Ketones, ur: 40 mg/dL — AB
Nitrite: POSITIVE — AB
Protein, ur: 30 mg/dL — AB
Specific Gravity, Urine: 1.021 (ref 1.005–1.030)
Urobilinogen, UA: >8 mg/dL — ABNORMAL HIGH (ref 0.0–1.0)
pH: 5 (ref 5.0–8.0)

## 2014-01-30 LAB — URINALYSIS, MICROSCOPIC ONLY
Casts: NONE SEEN
Squamous Epithelial / HPF: NONE SEEN

## 2014-02-01 ENCOUNTER — Ambulatory Visit: Payer: BC Managed Care – PPO

## 2014-02-01 LAB — URINE CULTURE: Colony Count: >=100000

## 2014-02-10 ENCOUNTER — Encounter: Payer: Self-pay | Admitting: Sports Medicine

## 2014-02-16 NOTE — Progress Notes (Signed)
Cardiology Office Note   Date:  02/17/2014   ID:  Kathleen Garcia, DOB 11/25/1950, MRN [id number]  PCP:  Rodney LangtonHEKKEKANDAM, THOMAS, MD  Cardiologist:   Vesta MixerNahser, Jinnifer Montejano J, MD   Chief Complaint  Patient presents with  . Follow-up    Pulmonary embolus   Problem List: 1. Palpitations 2. Pulmonary Embolus    History of Present Illness: Kathleen Judie PetitM Garcia is a 64 y.o. female who presents for follow up of her pulmonary embolus .  She developed worsening shortness of breath last April. D-dimer returned elevated. CT angiogram revealed bilateral PEs. Was started on  Xarelto but had severe nausea.  Was started on Coumdin for 8 months.  Follow up CT scan was normal.   Her doctor thought that it was due to the estrogen therapy. She has stopped her estrogen and feels much better.   Is back doing her normal activities.    Past Medical History  Diagnosis Date  . Palpitation   . Trigeminal neuralgia   . Chest pain   . Fluttering heart   . Irritable bowel syndrome   . Irregular heart rate   . Osteoporosis   . Diarrhea     The history was provided by the patient  . Vomiting     The history was provided by the patient  . Osteomyelitis     middle phalanx, right index finger  . Cervical spondylosis      with disk protrusion    Past Surgical History  Procedure Laterality Date  . Knee surgery    . Tonsillectomy    . Cervical disc surgery    . Tubal ligation    . Colonoscopy      with biopsies  . Upper gastrointestinal endoscopy       with biopsies     Current Outpatient Prescriptions  Medication Sig Dispense Refill  . benzonatate (TESSALON) 200 MG capsule Take 1 capsule (200 mg total) by mouth 3 (three) times daily as needed for cough. 45 capsule 0  . CALCIUM PO Take 1,500 mg by mouth daily.     . cetirizine (ZYRTEC) 10 MG tablet Take 10 mg by mouth daily.      . chlorpheniramine-HYDROcodone (TUSSIONEX) 10-8 MG/5ML LQCR Take 5 mLs by mouth every 12 (twelve) hours as needed for cough  (cough, will cause drowsiness.). 120 mL 0  . Cholecalciferol (VITAMIN D PO) Take 2,000 Units by mouth daily.     Marland Kitchen. dexlansoprazole (DEXILANT) 60 MG capsule Take 1 capsule (60 mg total) by mouth daily. 90 capsule 3  . fluticasone (FLONASE) 50 MCG/ACT nasal spray One spray in each nostril twice a day, use left hand for right nostril, and right hand for left nostril. (Patient taking differently: Place 2 sprays into both nostrils as needed for allergies or rhinitis. One spray in each nostril twice a day, use left hand for right nostril, and right hand for left nostril.) 48 g 3  . gabapentin (NEURONTIN) 100 MG capsule TAKE 1 CAPSULE (100 MG TOTAL) BY MOUTH DAILY AS NEEDED. (Patient taking differently: TAKE 1 CAPSULE (100 MG TOTAL) BY MOUTH BID 100MG  IN THE AM AND 300MG  IN THE PM) 30 capsule 1  . hyoscyamine (LEVSIN, ANASPAZ) 0.125 MG tablet Take 1 tablet (0.125 mg total) by mouth every 4 (four) hours as needed for cramping. 90 tablet 3  . LORazepam (ATIVAN) 1 MG tablet TAKE 1/2 TO 1 TABLET BY MOUTH TWICE A DAY AS NEEDED FOR NAUSEA 30 tablet 0  . montelukast (  SINGULAIR) 10 MG tablet Take 1 tablet (10 mg total) by mouth every morning. 90 tablet 3  . ranitidine (ZANTAC) 150 MG capsule One tablet 4 times weekly 48 capsule 3   No current facility-administered medications for this visit.    Allergies:   Morphine; Azithromycin; Betadine; Povidone-iodine; Promethazine hcl; Xarelto; and Septra    Social History:  The patient  reports that she has never smoked. She does not have any smokeless tobacco history on file. She reports that she does not drink alcohol or use illicit drugs.   Family History:  The patient's family history includes Coronary artery disease in her father; Hypertension in her mother.    ROS:  Please see the history of present illness.    Review of Systems: Constitutional:  denies fever, chills, diaphoresis, appetite change and fatigue.  HEENT: denies photophobia, eye pain, redness,  hearing loss, ear pain, congestion, sore throat, rhinorrhea, sneezing, neck pain, neck stiffness and tinnitus.  Respiratory: denies SOB, DOE, cough, chest tightness, and wheezing.  Cardiovascular: denies chest pain, palpitations and leg swelling.  Gastrointestinal: denies nausea, vomiting, abdominal pain, diarrhea, constipation, blood in stool.  Genitourinary: denies dysuria, urgency, frequency, hematuria, flank pain and difficulty urinating.  Musculoskeletal: denies  myalgias, back pain, joint swelling, arthralgias and gait problem.   Skin: denies pallor, rash and wound.  Neurological: denies dizziness, seizures, syncope, weakness, light-headedness, numbness and headaches.   Hematological: denies adenopathy, easy bruising, personal or family bleeding history.  Psychiatric/ Behavioral: denies suicidal ideation, mood changes, confusion, nervousness, sleep disturbance and agitation.       All other systems are reviewed and negative.    PHYSICAL EXAM: VS:  BP 108/64 mmHg  Pulse 89  Ht  (1.549 m)  Wt 126 lb 9.6 oz (57.425 kg)  BMI 23.93 kg/m2 , BMI Body mass index is 23.93 kg/(m^2). GEN: Well nourished, well developed, in no acute distress HEENT: normal Neck: no JVD, carotid bruits, or masses Cardiac: RRR; no murmurs, rubs, or gallops,no edema  Respiratory:  clear to auscultation bilaterally, normal work of breathing GI: soft, nontender, nondistended, + BS MS: no deformity or atrophy Skin: warm and dry, no rash Neuro:  Strength and sensation are intact Psych: normal   EKG:  EKG is ordered today. The ekg ordered today demonstrates NSR at 89 . Normal ECG    Recent Labs: 04/14/2013: ALT 9; TSH 0.856 04/17/2013: Hemoglobin 11.6*; Platelets 234 12/21/2013: BUN 9; Creatinine 0.75; Potassium 3.9; Sodium 140    Lipid Panel    Component Value Date/Time   CHOL 169 12/28/2011 1443   TRIG 75 12/28/2011 1443   HDL 59 12/28/2011 1443   CHOLHDL 2.9 12/28/2011 1443   VLDL 15  12/28/2011 1443   LDLCALC 95 12/28/2011 1443      Wt Readings from Last 3 Encounters:  02/17/14 126 lb 9.6 oz (57.425 kg)  12/03/13 124 lb (56.246 kg)  11/25/13 121 lb (54.885 kg)      Other studies Reviewed: Additional studies/ records that were reviewed today include: records from her admission . Review of the above records demonstrates: pulmonary embolus    ASSESSMENT AND PLAN:  1. Palpitations 2. Pulmonary Embolus   Current medicines are reviewed at length with the patient today.  The patient does not have concerns regarding medicines.  The following changes have been made:  no change   Disposition:   FU with me as neede.     Signed, Monnie Gudgel, Deloris Ping, MD  02/17/2014 4:57 PM  [facility] [address], [place], [place]  [address] Phone: [phone]; Fax: [phone]

## 2014-02-17 ENCOUNTER — Encounter: Payer: Self-pay | Admitting: Cardiovascular Disease

## 2014-02-17 ENCOUNTER — Encounter: Payer: Self-pay | Admitting: Sports Medicine

## 2014-02-17 ENCOUNTER — Ambulatory Visit (INDEPENDENT_AMBULATORY_CARE_PROVIDER_SITE_OTHER): Payer: BC Managed Care – PPO | Admitting: Cardiovascular Disease

## 2014-02-17 ENCOUNTER — Ambulatory Visit: Payer: BC Managed Care – PPO

## 2014-02-17 VITALS — BP 108/64 | HR 89 | Ht 61.0 in | Wt 126.6 lb

## 2014-02-17 DIAGNOSIS — I2699 Other pulmonary embolism without acute cor pulmonale: Secondary | ICD-10-CM

## 2014-02-17 DIAGNOSIS — R002 Palpitations: Secondary | ICD-10-CM

## 2014-02-17 NOTE — Telephone Encounter (Signed)
Just going to CC you what I sent to Isabel.  "Emarie the reason we like to obtain a urine specimen is that we can isolate the bacteria.  This helps to tailor antibiotics as not every bacteria is sensitive to every antibiotic.  Since we didn't get a complete course of septra, the ecoli can develop resistance so its important to at least get another specimen which you can drop off in the nurse visit, you don't have to see me.  We can then run a culture off of it.  It may not even be e coli this time.  The treatment of recurrent UTI does require some microbiologic investigation."

## 2014-02-17 NOTE — Patient Instructions (Signed)
Your physician recommends that you continue on your current medications as directed. Please refer to the Current Medication list given to you today.  Your physician recommends that you schedule a follow-up appointment in: as needed with Dr. Nahser  

## 2014-02-18 ENCOUNTER — Ambulatory Visit (INDEPENDENT_AMBULATORY_CARE_PROVIDER_SITE_OTHER): Payer: BC Managed Care – PPO | Admitting: Sports Medicine

## 2014-02-18 VITALS — BP 101/57 | HR 95 | Temp 97.5°F | Ht 61.0 in | Wt 125.0 lb

## 2014-02-18 DIAGNOSIS — N3090 Cystitis, unspecified without hematuria: Secondary | ICD-10-CM

## 2014-02-18 DIAGNOSIS — R319 Hematuria, unspecified: Secondary | ICD-10-CM | POA: Diagnosis not present

## 2014-02-18 DIAGNOSIS — R300 Dysuria: Secondary | ICD-10-CM

## 2014-02-18 DIAGNOSIS — R1030 Lower abdominal pain, unspecified: Secondary | ICD-10-CM

## 2014-02-18 DIAGNOSIS — N3080 Other cystitis without hematuria: Secondary | ICD-10-CM | POA: Diagnosis not present

## 2014-02-18 LAB — POCT URINALYSIS DIPSTICK
Bilirubin, UA: NEGATIVE
Glucose, UA: NEGATIVE
Ketones, UA: NEGATIVE
Nitrite, UA: NEGATIVE
Protein, UA: NEGATIVE
Spec Grav, UA: >=1.03
Urobilinogen, UA: 0.2
pH, UA: 5.5

## 2014-02-18 MED ORDER — CEPHALEXIN 500 MG PO CAPS: 500.0000 mg | ORAL_CAPSULE | Freq: Two times a day (BID) | ORAL | Status: DC

## 2014-02-18 NOTE — Progress Notes (Signed)
   Subjective:    Patient ID: Kathleen Garcia, female    DOB: 06/15/1950, 64 y.o.   MRN: [id number]   Pt in today to drop off urine. Complains of lower abdominal pain and pressure. [address]bony Brigham, SCMA Donne AnonAmber Jasani Lengel, [place]  HPI    Review of Systems     Objective:   Physical Exam        Assessment & Plan:

## 2014-02-18 NOTE — Assessment & Plan Note (Addendum)
Allergic reaction to Septra, did not finish course. Continues to have pyuria. Adding Keflex, if she has an additional UTI we will probably due to chronic suppressive therapy. Awaiting culture.  Patient reported multiple medication intolerances that have not been recorded, we are now going to try nitrofurantoin. Discontinue Keflex.

## 2014-02-19 MED ORDER — NITROFURANTOIN MONOHYD MACRO 100 MG PO CAPS: 100.0000 mg | ORAL_CAPSULE | Freq: Two times a day (BID) | ORAL | Status: DC

## 2014-02-19 NOTE — Progress Notes (Signed)
Kathleen Garcia states Keflex causes vomiting, diarrhea and nausea. She reports no symptoms for the last few days. She has never been on nitrofurantoin.

## 2014-02-19 NOTE — Addendum Note (Signed)
Addended by: Monica BectonHEKKEKANDAM, Judith Demps J on: 02/19/2014 05:08 PM   Modules accepted: Orders, Medications

## 2014-02-20 LAB — URINE CULTURE
Colony Count: NO GROWTH
Organism ID, Bacteria: NO GROWTH

## 2014-02-23 ENCOUNTER — Encounter: Payer: Self-pay | Admitting: Sports Medicine

## 2014-03-03 ENCOUNTER — Telehealth: Payer: Self-pay | Admitting: Cardiovascular Disease

## 2014-03-03 NOTE — Telephone Encounter (Signed)
New message    Patient calling   122/78 pulse 84. Today    Pt c/o medication issue:  1. Name of Medication: metoprolol was discontinue   2. How are you currently taking this medication (dosage and times per day)? Medication was discontinue   3. Are you having a reaction (difficulty breathing--STAT)? No sob, flip / flop   4. What is your medication issue? Does patient need to restart metoprolol.

## 2014-03-04 MED ORDER — METOPROLOL SUCCINATE ER 25 MG PO TB24: 25.0000 mg | ORAL_TABLET | Freq: Every day | ORAL | Status: DC

## 2014-03-04 NOTE — Telephone Encounter (Signed)
Spoke with patient who states she has not felt well since taking Excedrin Migraine on Tuesday.  Patient states she has felt her heart "flip-flopping" and her has felt like it was racing.  Patient's Toprol XL was discontinued a few months ago due to hypotension.  Patient states she was feeling so poor yesterday, that she took one of her Toprol XL 50 mg tabs and reports she feels better today, just a little tired.  I discussed with Dr. Elease HashimotoNahser who is in the office today and advised patient that she can take 1/2 or 1 tab for these symptoms.  I advised that we could send Rx for Toprol XL 25 mg tabs, which patient is in agreement with; she states she will monitor for hypotension and will d/c medication and notify us if that occurs.

## 2014-03-09 ENCOUNTER — Other Ambulatory Visit: Payer: Self-pay

## 2014-03-09 DIAGNOSIS — Z1231 Encounter for screening mammogram for malignant neoplasm of breast: Secondary | ICD-10-CM

## 2014-03-11 ENCOUNTER — Telehealth: Payer: Self-pay | Admitting: Cardiovascular Disease

## 2014-03-11 NOTE — Telephone Encounter (Signed)
Patient's SBP has been running between 110 to 118. Patient has been taking Metoprolol 25 mg once daily. This morning since patient felt like her heart was doing "flip /flops" that she took at total on 50 mg this morning. Asked patient if the " flip flop" felt like her heart was out of rhythm, she said yes, but it goes away when she coughs. Now her BP is 90/40. Patient states "I just feel a little weak and I am drinking lots of water." Encourage patient to eat something salty and to check BP again. Advised patient to always check BP before taking any Metoprolol. Patient stated "I use to tolerate the 50 mg, but I guess I can't now."  Informed patient that I would forward to Dr. Elease HashimotoNahser for instructions.

## 2014-03-11 NOTE — Telephone Encounter (Signed)
New message    Pt c/o medication issue:  1. Name of Medication: toprol    2. How are you currently taking this medication (dosage and times per day)? Try 25 mg / then 25 mg in am / in pm  . Took  50 mg this am    3. Are you having a reaction (difficulty breathing--STAT)? Flip / flop   4. What is your medication issue? B/p  90/40 @ 15 min ago.  Right arm.

## 2014-03-12 NOTE — Telephone Encounter (Signed)
Would try a V-8 juice tonight - maybe the extra potassium will help

## 2014-03-12 NOTE — Telephone Encounter (Signed)
Agree with recs.  Drink extra fluids for low BP.

## 2014-03-12 NOTE — Telephone Encounter (Signed)
Called patient back. Informed her of Dr. Harvie BridgeNahser's instructions. Patient's BP still running on the low side 100/60 today and still having episodes of heart flip/floping. Patient is wondering if there is something else she can take to help with the flip/floping that would not effect her BP as much. Encourage patient to continue to check BP before taking medication and to call office if she has any other concerns.

## 2014-03-15 NOTE — Telephone Encounter (Signed)
Called patient and informed her of Dr. Harvie BridgeNahser's recommendation. Patient stated she could not handle V-8 juice, but would try some Gatorade. Encouraged patient to call office with any other concerns. Patient verbalized understanding.

## 2014-03-19 ENCOUNTER — Other Ambulatory Visit: Payer: Self-pay | Admitting: Sports Medicine

## 2014-03-21 ENCOUNTER — Encounter: Payer: Self-pay | Admitting: Sports Medicine

## 2014-03-22 MED ORDER — HYDROCOD POLST-CHLORPHEN POLST 10-8 MG/5ML PO LQCR: 5.0000 mL | Freq: Two times a day (BID) | ORAL | Status: DC | PRN

## 2014-03-22 NOTE — Telephone Encounter (Signed)
Rx for tussionex in box for patient to pick up.

## 2014-03-29 ENCOUNTER — Encounter: Payer: Self-pay | Admitting: Sports Medicine

## 2014-03-29 ENCOUNTER — Ambulatory Visit (INDEPENDENT_AMBULATORY_CARE_PROVIDER_SITE_OTHER): Payer: BC Managed Care – PPO | Admitting: Sports Medicine

## 2014-03-29 DIAGNOSIS — M1990 Unspecified osteoarthritis, unspecified site: Secondary | ICD-10-CM | POA: Diagnosis not present

## 2014-03-29 DIAGNOSIS — M19041 Primary osteoarthritis, right hand: Secondary | ICD-10-CM

## 2014-03-29 MED ORDER — DICLOFENAC SODIUM 2 % TD SOLN: 2.0000 | Freq: Two times a day (BID) | TRANSDERMAL | Status: DC

## 2014-03-29 MED ORDER — MELOXICAM 15 MG PO TABS: ORAL_TABLET | ORAL | Status: DC

## 2014-03-29 NOTE — Progress Notes (Signed)
  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 Pain is moderate, persistent with gelling localized at all proximal interphalangeal joints of the hands.  Past medical history, Surgical history, Family history not pertinant except as noted below, Social history, Allergies, and medications have been entered into the medical record, reviewed, and no changes needed.   Review of Systems: No fevers, chills, night sweats, weight loss, chest pain, or shortness of breath.   Objective:    General: Well Developed, well nourished, and in no acute distress.  Neuro: Alert and oriented x3, extra-ocular muscles intact, sensation grossly intact.  HEENT: Normocephalic, atraumatic, pupils equal round reactive to light, neck supple, no masses, no lymphadenopathy, thyroid nonpalpable.  Skin: Warm and dry, no rashes. Cardiac: Regular rate and rhythm, no murmurs rubs or gallops, no lower extremity edema.  Respiratory: Clear to auscultation bilaterally. Not using accessory muscles, speaking in full sentences.  Impression and Recommendations:

## 2014-03-29 NOTE — Assessment & Plan Note (Signed)
Pennsaid samples given, starting meloxicam once a day. Return in one month. Pain is at the proximal interphalangeal joints and we can certainly do injections if no improvement.

## 2014-04-08 ENCOUNTER — Ambulatory Visit: Payer: BC Managed Care – PPO

## 2014-04-13 ENCOUNTER — Ambulatory Visit
Admission: RE | Admit: 2014-04-13 | Discharge: 2014-04-13 | Disposition: A | Discharge status: Home / Self Care | Payer: BC Managed Care – PPO | Source: Ambulatory Visit

## 2014-04-13 DIAGNOSIS — Z1231 Encounter for screening mammogram for malignant neoplasm of breast: Secondary | ICD-10-CM

## 2014-04-21 ENCOUNTER — Other Ambulatory Visit: Payer: Self-pay | Admitting: Sports Medicine

## 2014-04-29 ENCOUNTER — Ambulatory Visit: Payer: BC Managed Care – PPO | Admitting: Sports Medicine

## 2014-05-08 ENCOUNTER — Other Ambulatory Visit: Payer: Self-pay | Admitting: Sports Medicine

## 2014-05-21 ENCOUNTER — Other Ambulatory Visit: Payer: Self-pay | Admitting: Sports Medicine

## 2014-07-13 ENCOUNTER — Other Ambulatory Visit: Payer: Self-pay | Admitting: Sports Medicine

## 2014-07-19 ENCOUNTER — Other Ambulatory Visit: Payer: Self-pay | Admitting: Obstetrics and Gynecology

## 2014-07-20 LAB — CYTOLOGY - PAP

## 2014-08-10 ENCOUNTER — Other Ambulatory Visit: Payer: Self-pay | Admitting: Sports Medicine

## 2014-08-12 ENCOUNTER — Telehealth: Payer: Self-pay | Admitting: Sports Medicine

## 2014-08-12 DIAGNOSIS — J040 Acute laryngitis: Secondary | ICD-10-CM

## 2014-08-12 NOTE — Telephone Encounter (Signed)
Received fax for prior authorization on dexilant sent through cover my meds waiting on authorization. - CF

## 2014-08-17 MED ORDER — PANTOPRAZOLE SODIUM 40 MG PO TBEC: 40.0000 mg | DELAYED_RELEASE_TABLET | Freq: Every day | ORAL | Status: DC

## 2014-08-17 NOTE — Telephone Encounter (Signed)
Pt called clinic to state the Protonix doesn't work and wants to see if we can get the Dexilant approved. Returned Pt call, did not answer so voicemail was left. Advised we need to document the failure of Protonix and then we can revisit the approval process for Dexilant. Callback information provided if Pt has any further questions.

## 2014-08-17 NOTE — Telephone Encounter (Signed)
Sigh, changed to pantoprazole.

## 2014-08-17 NOTE — Telephone Encounter (Signed)
Received denial from [facility] regarding Dexilant Rx. Was advised Pt should try and fail a preferred PPI (generic esomeprazole mag delayed-release, generic rabeprazole delayed-release, generic omeprazole delayed-release, generic lansoprazole delayed-release, generic pantoprazole delayed-release, generic omeprazole/sodium bicarb capsules).  Denial placed in PCP's box for review.

## 2014-08-17 NOTE — Addendum Note (Signed)
Addended by: Monica Becton on: 08/17/2014 01:15 PM   Modules accepted: Orders

## 2014-08-18 ENCOUNTER — Encounter: Payer: Self-pay | Admitting: Sports Medicine

## 2014-08-18 NOTE — Telephone Encounter (Signed)
New Rx for Pantoprazole required PA. This was authorized, Berkley Harvey #[id number], valid: 07/19/14-08/18/15. Pharmacy notified. Patient notified.   While on the phone with Teofilo Pod, from the PA department, it was determined PCP can write a letter of appeal for the Dexilant Rx. This letter would need to state the Pt's failed treatments with Protonix and Prilosec in the past. The approved Pantoprazole will ease the Pt's symptoms while appeal process determines authorization on Dexilant. This letter of appeal should be mailed to:   Pharmacy Appeals Coordinator [address] [address] [place], [place] [address] Attn: Zola Button and Grievance dept.   Will route to PCP for review and letter.

## 2014-08-18 NOTE — Telephone Encounter (Signed)
Called Pt, states she has tried and failed the following: Prilosec, Protonix, Nexium, Reglan, and Zantac.

## 2014-08-18 NOTE — Telephone Encounter (Signed)
Letter of appeal written by physician, placed in labeled envelope, and sent out with the mail. Will contact Pt when we receive a response.

## 2014-09-10 ENCOUNTER — Other Ambulatory Visit: Payer: Self-pay | Admitting: Sports Medicine

## 2014-09-17 ENCOUNTER — Other Ambulatory Visit: Payer: Self-pay | Admitting: Sports Medicine

## 2015-01-24 ENCOUNTER — Other Ambulatory Visit: Payer: Self-pay | Admitting: Sports Medicine

## 2015-02-11 ENCOUNTER — Ambulatory Visit (INDEPENDENT_AMBULATORY_CARE_PROVIDER_SITE_OTHER): Payer: BC Managed Care – PPO | Admitting: Sports Medicine

## 2015-02-11 ENCOUNTER — Ambulatory Visit (INDEPENDENT_AMBULATORY_CARE_PROVIDER_SITE_OTHER): Payer: BC Managed Care – PPO

## 2015-02-11 ENCOUNTER — Encounter: Payer: Self-pay | Admitting: Sports Medicine

## 2015-02-11 DIAGNOSIS — R05 Cough: Secondary | ICD-10-CM | POA: Diagnosis not present

## 2015-02-11 DIAGNOSIS — R509 Fever, unspecified: Secondary | ICD-10-CM | POA: Diagnosis not present

## 2015-02-11 DIAGNOSIS — R059 Cough, unspecified: Secondary | ICD-10-CM

## 2015-02-11 DIAGNOSIS — R0989 Other specified symptoms and signs involving the circulatory and respiratory systems: Secondary | ICD-10-CM

## 2015-02-11 MED ORDER — LORAZEPAM 1 MG PO TABS: ORAL_TABLET | ORAL | Status: DC

## 2015-02-11 MED ORDER — HYOSCYAMINE SULFATE 0.125 MG PO TABS: 0.1250 mg | ORAL_TABLET | ORAL | Status: DC | PRN

## 2015-02-11 MED ORDER — BENZONATATE 200 MG PO CAPS: 200.0000 mg | ORAL_CAPSULE | Freq: Three times a day (TID) | ORAL | Status: DC | PRN

## 2015-02-11 MED ORDER — DOXYCYCLINE HYCLATE 100 MG PO TABS
100.0000 mg | ORAL_TABLET | Freq: Two times a day (BID) | ORAL | Status: AC
Start: 2015-02-11 — End: 2015-02-18

## 2015-02-11 NOTE — Progress Notes (Signed)
  Subjective:    CC: Coughing  HPI: This is a pleasant 65 year old female, for the past week and a half she's had increasing cough, hoarseness, malaise, cough is productive of greenish sputum. Symptoms are moderate, persistent.  Past medical history, Surgical history, Family history not pertinant except as noted below, Social history, Allergies, and medications have been entered into the medical record, reviewed, and no changes needed.   Review of Systems: No fevers, chills, night sweats, weight loss, chest pain, or shortness of breath.   Objective:    General: Well Developed, well nourished, and in no acute distress.  Neuro: Alert and oriented x3, extra-ocular muscles intact, sensation grossly intact.  HEENT: Normocephalic, atraumatic, pupils equal round reactive to light, neck supple, no masses, no lymphadenopathy, thyroid nonpalpable. Oropharynx, nasopharynx, ear canals unremarkable. Skin: Warm and dry, no rashes. Cardiac: Regular rate and rhythm, no murmurs rubs or gallops, no lower extremity edema.  Respiratory: Bronchitic sounds bilaterally. Not using accessory muscles, speaking in full sentences.  Impression and Recommendations:    I spent 25 minutes with this patient, greater than 50% was face-to-face time counseling regarding the above diagnoses

## 2015-02-11 NOTE — Assessment & Plan Note (Signed)
Productive cough for almost 2 weeks now, chest x-ray, Tessalon Perles, doxycycline, return to see me if no better in 2 weeks.

## 2015-02-18 ENCOUNTER — Other Ambulatory Visit: Payer: Self-pay | Admitting: Cardiovascular Disease

## 2015-03-18 ENCOUNTER — Other Ambulatory Visit: Payer: Self-pay

## 2015-03-18 DIAGNOSIS — Z1231 Encounter for screening mammogram for malignant neoplasm of breast: Secondary | ICD-10-CM

## 2015-05-02 ENCOUNTER — Ambulatory Visit
Admission: RE | Admit: 2015-05-02 | Discharge: 2015-05-02 | Disposition: A | Discharge status: Home / Self Care | Payer: BC Managed Care – PPO | Source: Ambulatory Visit

## 2015-05-02 DIAGNOSIS — Z1231 Encounter for screening mammogram for malignant neoplasm of breast: Secondary | ICD-10-CM

## 2015-05-18 ENCOUNTER — Other Ambulatory Visit: Payer: Self-pay | Admitting: Cardiovascular Disease

## 2015-05-18 NOTE — Telephone Encounter (Signed)
Rx request sent to pharmacy.  

## 2015-09-01 ENCOUNTER — Encounter: Payer: Self-pay | Admitting: Sports Medicine

## 2015-09-01 DIAGNOSIS — R0602 Shortness of breath: Secondary | ICD-10-CM

## 2015-09-07 ENCOUNTER — Encounter: Payer: Self-pay | Admitting: Sports Medicine

## 2015-09-07 LAB — CBC
HCT: 33.9 % — ABNORMAL LOW (ref 35.0–45.0)
Hemoglobin: 11.5 g/dL — ABNORMAL LOW (ref 11.7–15.5)
MCH: 30.2 pg (ref 27.0–33.0)
MCHC: 33.9 g/dL (ref 32.0–36.0)
MCV: 89 fL (ref 80.0–100.0)
MPV: 10.4 fL (ref 7.5–12.5)
Platelets: 280 K/uL (ref 140–400)
RBC: 3.81 MIL/uL (ref 3.80–5.10)
RDW: 13.9 % (ref 11.0–15.0)
WBC: 6.5 10*3/uL (ref 3.8–10.8)

## 2015-09-07 LAB — COMPREHENSIVE METABOLIC PANEL
Albumin: 3.9 g/dL (ref 3.6–5.1)
CO2: 24 mmol/L (ref 20–31)
Calcium: 9.1 mg/dL (ref 8.6–10.4)
Chloride: 104 mmol/L (ref 98–110)
Creat: 0.74 mg/dL (ref 0.50–0.99)
Potassium: 3.9 mmol/L (ref 3.5–5.3)
Sodium: 142 mmol/L (ref 135–146)

## 2015-09-07 LAB — COMPREHENSIVE METABOLIC PANEL WITH GFR
ALT: 11 U/L (ref 6–29)
AST: 22 U/L (ref 10–35)
Alkaline Phosphatase: 68 U/L (ref 33–130)
BUN: 14 mg/dL (ref 7–25)
Glucose, Bld: 69 mg/dL (ref 65–99)
Total Bilirubin: 0.3 mg/dL (ref 0.2–1.2)
Total Protein: 6.7 g/dL (ref 6.1–8.1)

## 2015-09-07 LAB — D-DIMER, QUANTITATIVE: D-Dimer, Quant: 0.53 ug{FEU}/mL — ABNORMAL HIGH (ref <0.50)

## 2015-09-07 NOTE — Addendum Note (Signed)
Addended by: Monica BectonHEKKEKANDAM, Takeisha Cianci J on: 09/07/2015 04:57 PM   Modules accepted: Orders

## 2015-09-07 NOTE — Telephone Encounter (Signed)
Spoke with Pt, no need for PCP to call.

## 2015-09-08 ENCOUNTER — Ambulatory Visit (INDEPENDENT_AMBULATORY_CARE_PROVIDER_SITE_OTHER): Payer: BC Managed Care – PPO

## 2015-09-08 DIAGNOSIS — R0789 Other chest pain: Secondary | ICD-10-CM | POA: Diagnosis not present

## 2015-09-08 DIAGNOSIS — R0602 Shortness of breath: Secondary | ICD-10-CM

## 2015-09-08 MED ORDER — IOPAMIDOL (ISOVUE-370) INJECTION 76%
100.0000 mL | Freq: Once | INTRAVENOUS | Status: AC | PRN
  Administered 2015-09-08: 100 mL via INTRAVENOUS

## 2015-09-09 ENCOUNTER — Ambulatory Visit (INDEPENDENT_AMBULATORY_CARE_PROVIDER_SITE_OTHER): Payer: BC Managed Care – PPO | Admitting: Sports Medicine

## 2015-09-09 DIAGNOSIS — R002 Palpitations: Secondary | ICD-10-CM | POA: Diagnosis not present

## 2015-09-09 DIAGNOSIS — F411 Generalized anxiety disorder: Secondary | ICD-10-CM | POA: Diagnosis not present

## 2015-09-09 MED ORDER — NEBIVOLOL HCL 2.5 MG PO TABS: 2.5000 mg | ORAL_TABLET | Freq: Every day | ORAL | 3 refills | Status: DC

## 2015-09-09 NOTE — Assessment & Plan Note (Signed)
Does okay with an occasional Ativan. No changes. Increase in stress recently since school has restarted.

## 2015-09-09 NOTE — Progress Notes (Signed)
  Procedure: Real-time Ultrasound Guided peripheral vascular access catheter placement Device: GE Logiq E  Verbal informed consent obtained.  Time-out conducted.  Noted no overlying erythema, induration, or other signs of local infection.  Skin prepped in a sterile fashion.  Local anesthesia: Topical Ethyl chloride.  With sterile technique and under real time ultrasound guidance:  IV access was attempted 4 times without success, and I was called for further evaluation. I localized the cephalic vein overlying the left biceps, I guided a 20-gauge angiocatheter into the cephalic vein, I got a good flash, and advanced the catheter into the vein. The vein and catheter were then flushed easily and IV contrast was able to be infused appropriately. Completed without difficulty  Pain immediately resolved suggesting accurate placement of the medication.  Advised to call if fevers/chills, erythema, induration, drainage, or persistent bleeding.  Images permanently stored and available for review in the ultrasound unit.  Impression: Technically successful ultrasound guided peripheral venous access.

## 2015-09-09 NOTE — Assessment & Plan Note (Signed)
Very intermittent, switching to Bystolic due to fatigue with metoprolol. Adding anemia panel and TSH to lab work.

## 2015-09-09 NOTE — Progress Notes (Signed)
  Subjective:    CC: Follow-up  HPI: Shortness of breath: Improved, she did have an elevated d-dimer and subsequently a negative CT angiogram.  Fatigue: Hemoglobin was slightly low, on further questioning her fatigue started when the school year started, she has had to take a bit of Ativan, suggesting anxiety as a cause for her fatigue. She is taking metoprolol for palpitations, is agreeable to switch to one that does not cross the blood-brain barrier.  Past medical history, Surgical history, Family history not pertinant except as noted below, Social history, Allergies, and medications have been entered into the medical record, reviewed, and no changes needed.   Review of Systems: No fevers, chills, night sweats, weight loss, chest pain, or shortness of breath.   Objective:    General: Well Developed, well nourished, and in no acute distress.  Neuro: Alert and oriented x3, extra-ocular muscles intact, sensation grossly intact.  HEENT: Normocephalic, atraumatic, pupils equal round reactive to light, neck supple, no masses, no lymphadenopathy, thyroid nonpalpable.  Skin: Warm and dry, no rashes. Cardiac: Regular rate and rhythm, no murmurs rubs or gallops, no lower extremity edema.  Respiratory: Clear to auscultation bilaterally. Not using accessory muscles, speaking in full sentences.  Impression and Recommendations:    Palpitations Very intermittent, switching to Bystolic due to fatigue with metoprolol. Adding anemia panel and TSH to lab work.  Generalized anxiety disorder Does okay with an occasional Ativan. No changes. Increase in stress recently since school has restarted.  I spent 25 minutes with this patient, greater than 50% was face-to-face time counseling regarding the above diagnoses

## 2015-09-10 LAB — FERRITIN: Ferritin: 36 ng/mL (ref 20–288)

## 2015-09-10 LAB — IRON AND TIBC
%SAT: 21 % (ref 11–50)
Iron: 67 ug/dL (ref 45–160)
TIBC: 314 ug/dL (ref 250–450)
UIBC: 247 ug/dL (ref 125–400)

## 2015-09-10 LAB — VITAMIN B12: Vitamin B-12: 532 pg/mL (ref 200–1100)

## 2015-09-10 LAB — TSH: TSH: 0.91 m[IU]/L

## 2015-09-10 LAB — FOLATE: Folate: 12.7 ng/mL (ref >5.4)

## 2015-09-21 ENCOUNTER — Other Ambulatory Visit: Payer: Self-pay | Admitting: Sports Medicine

## 2015-09-30 ENCOUNTER — Other Ambulatory Visit: Payer: Self-pay | Admitting: Sports Medicine

## 2015-10-07 ENCOUNTER — Ambulatory Visit (INDEPENDENT_AMBULATORY_CARE_PROVIDER_SITE_OTHER): Payer: BC Managed Care – PPO | Admitting: Sports Medicine

## 2015-10-07 ENCOUNTER — Encounter: Payer: Self-pay | Admitting: Sports Medicine

## 2015-10-07 DIAGNOSIS — F411 Generalized anxiety disorder: Secondary | ICD-10-CM | POA: Diagnosis not present

## 2015-10-07 DIAGNOSIS — R002 Palpitations: Secondary | ICD-10-CM | POA: Diagnosis not present

## 2015-10-07 MED ORDER — LORAZEPAM 0.5 MG PO TABS: 0.5000 mg | ORAL_TABLET | ORAL | 3 refills | Status: DC | PRN

## 2015-10-07 MED ORDER — NEBIVOLOL HCL 5 MG PO TABS: 5.0000 mg | ORAL_TABLET | Freq: Every day | ORAL | 11 refills | Status: DC

## 2015-10-07 NOTE — Assessment & Plan Note (Signed)
Doing much better on by solid with less fatigue than metoprolol. Increasing by start to 5 mg, she will liberalize salt in her diet to cushion her blood pressure.

## 2015-10-07 NOTE — Assessment & Plan Note (Signed)
Refilling Ativan

## 2015-10-07 NOTE — Progress Notes (Signed)
  Subjective:    CC: Follow-up  HPI: Palpitations: Did have some fatigue on metoprolol, now everything is well controlled with Bystolic, has only occasional palpitations.  Orthostasis: Has occurred since well before starting on a beta blocker, only occurs getting up very quickly, agreeable to take things more gingerly.  Generalized anxiety: Needs a refill on Ativan.    Past medical history:  Negative.  See flowsheet/record as well for more information.  Surgical history: Negative.  See flowsheet/record as well for more information.  Family history: Negative.  See flowsheet/record as well for more information.  Social history: Negative.  See flowsheet/record as well for more information.  Allergies, and medications have been entered into the medical record, reviewed, and no changes needed.   Review of Systems: No fevers, chills, night sweats, weight loss, chest pain, or shortness of breath.   Objective:    General: Well Developed, well nourished, and in no acute distress.  Neuro: Alert and oriented x3, extra-ocular muscles intact, sensation grossly intact.  HEENT: Normocephalic, atraumatic, pupils equal round reactive to light, neck supple, no masses, no lymphadenopathy, thyroid nonpalpable.  Skin: Warm and dry, no rashes. Cardiac: Regular rate and rhythm, no murmurs rubs or gallops, no lower extremity edema.  Respiratory: Clear to auscultation bilaterally. Not using accessory muscles, speaking in full sentences.   Impression and Recommendations:    Palpitations Doing much better on by solid with less fatigue than metoprolol. Increasing by start to 5 mg, she will liberalize salt in her diet to cushion her blood pressure.  Generalized anxiety disorder Refilling Ativan.  I spent 25 minutes with this patient, greater than 50% was face-to-face time counseling regarding the above diagnoses

## 2015-10-12 ENCOUNTER — Encounter: Payer: Self-pay | Admitting: Sports Medicine

## 2015-10-13 MED ORDER — HYDROCOD POLST-CPM POLST ER 10-8 MG/5ML PO SUER: 5.0000 mL | Freq: Two times a day (BID) | ORAL | 0 refills | Status: DC | PRN

## 2015-11-19 ENCOUNTER — Encounter: Payer: Self-pay | Admitting: Sports Medicine

## 2015-11-19 DIAGNOSIS — H8113 Benign paroxysmal vertigo, bilateral: Secondary | ICD-10-CM

## 2015-12-02 NOTE — Addendum Note (Signed)
Addended by: Monica BectonHEKKEKANDAM, THOMAS J on: 12/02/2015 10:39 AM   Modules accepted: Orders

## 2015-12-09 ENCOUNTER — Other Ambulatory Visit: Payer: Self-pay | Admitting: *Deleted

## 2015-12-09 DIAGNOSIS — R002 Palpitations: Secondary | ICD-10-CM

## 2015-12-09 MED ORDER — NEBIVOLOL HCL 5 MG PO TABS: 5.0000 mg | ORAL_TABLET | Freq: Every day | ORAL | 0 refills | Status: DC

## 2016-02-28 ENCOUNTER — Telehealth: Payer: Self-pay | Admitting: Sports Medicine

## 2016-02-28 NOTE — Telephone Encounter (Signed)
Documented

## 2016-02-28 NOTE — Telephone Encounter (Signed)
Pt declined

## 2016-03-01 ENCOUNTER — Other Ambulatory Visit: Payer: Self-pay | Admitting: Sports Medicine

## 2016-03-21 ENCOUNTER — Other Ambulatory Visit: Payer: Self-pay | Admitting: Obstetrics and Gynecology

## 2016-03-21 DIAGNOSIS — Z1231 Encounter for screening mammogram for malignant neoplasm of breast: Secondary | ICD-10-CM

## 2016-03-28 ENCOUNTER — Other Ambulatory Visit: Payer: Self-pay | Admitting: Sports Medicine

## 2016-03-28 ENCOUNTER — Ambulatory Visit (INDEPENDENT_AMBULATORY_CARE_PROVIDER_SITE_OTHER): Payer: BC Managed Care – PPO | Admitting: Family Medicine

## 2016-03-28 VITALS — BP 122/50 | HR 76 | Wt 138.0 lb

## 2016-03-28 DIAGNOSIS — J0101 Acute recurrent maxillary sinusitis: Secondary | ICD-10-CM

## 2016-03-28 DIAGNOSIS — R002 Palpitations: Secondary | ICD-10-CM

## 2016-03-28 MED ORDER — PREDNISONE 10 MG PO TABS: 30.0000 mg | ORAL_TABLET | Freq: Every day | ORAL | 0 refills | Status: DC

## 2016-03-28 MED ORDER — IPRATROPIUM BROMIDE 0.06 % NA SOLN: 2.0000 | NASAL | 6 refills | Status: DC | PRN

## 2016-03-28 MED ORDER — CEFDINIR 250 MG/5ML PO SUSR: 300.0000 mg | Freq: Two times a day (BID) | ORAL | 0 refills | Status: DC

## 2016-03-28 NOTE — Patient Instructions (Signed)
Thank you for coming in today. Use the atrovent nasal spray.  If worsening take prednisone or omnicef.  Return as needed.  Call or go to the emergency room if you get worse, have trouble breathing, have chest pains, or palpitations.    Sinusitis, Adult Sinusitis is soreness and inflammation of your sinuses. Sinuses are hollow spaces in the bones around your face. Your sinuses are located:  Around your eyes.  In the middle of your forehead.  Behind your nose.  In your cheekbones. Your sinuses and nasal passages are lined with a stringy fluid (mucus). Mucus normally drains out of your sinuses. When your nasal tissues become inflamed or swollen, the mucus can become trapped or blocked so air cannot flow through your sinuses. This allows bacteria, viruses, and funguses to grow, which leads to infection. Sinusitis can develop quickly and last for 7?10 days (acute) or for more than 12 weeks (chronic). Sinusitis often develops after a cold. What are the causes? This condition is caused by anything that creates swelling in the sinuses or stops mucus from draining, including:  Allergies.  Asthma.  Bacterial or viral infection.  Abnormally shaped bones between the nasal passages.  Nasal growths that contain mucus (nasal polyps).  Narrow sinus openings.  Pollutants, such as chemicals or irritants in the air.  A foreign object stuck in the nose.  A fungal infection. This is rare. What increases the risk? The following factors may make you more likely to develop this condition:  Having allergies or asthma.  Having had a recent cold or respiratory tract infection.  Having structural deformities or blockages in your nose or sinuses.  Having a weak immune system.  Doing a lot of swimming or diving.  Overusing nasal sprays.  Smoking. What are the signs or symptoms? The main symptoms of this condition are pain and a feeling of pressure around the affected sinuses. Other symptoms  include:  Upper toothache.  Earache.  Headache.  Bad breath.  Decreased sense of smell and taste.  A cough that may get worse at night.  Fatigue.  Fever.  Thick drainage from your nose. The drainage is often green and it may contain pus (purulent).  Stuffy nose or congestion.  Postnasal drip. This is when extra mucus collects in the throat or back of the nose.  Swelling and warmth over the affected sinuses.  Sore throat.  Sensitivity to light. How is this diagnosed? This condition is diagnosed based on symptoms, a medical history, and a physical exam. To find out if your condition is acute or chronic, your health care provider may:  Look in your nose for signs of nasal polyps.  Tap over the affected sinus to check for signs of infection.  View the inside of your sinuses using an imaging device that has a light attached (endoscope). If your health care provider suspects that you have chronic sinusitis, you may also:  Be tested for allergies.  Have a sample of mucus taken from your nose (nasal culture) and checked for bacteria.  Have a mucus sample examined to see if your sinusitis is related to an allergy. If your sinusitis does not respond to treatment and it lasts longer than 8 weeks, you may have an MRI or CT scan to check your sinuses. These scans also help to determine how severe your infection is. In rare cases, a bone biopsy may be done to rule out more serious types of fungal sinus disease. How is this treated? Treatment for sinusitis  depends on the cause and whether your condition is chronic or acute. If a virus is causing your sinusitis, your symptoms will go away on their own within 10 days. You may be given medicines to relieve your symptoms, including:  Topical nasal decongestants. They shrink swollen nasal passages and let mucus drain from your sinuses.  Antihistamines. These drugs block inflammation that is triggered by allergies. This can help to ease  swelling in your nose and sinuses.  Topical nasal corticosteroids. These are nasal sprays that ease inflammation and swelling in your nose and sinuses.  Nasal saline washes. These rinses can help to get rid of thick mucus in your nose. If your condition is caused by bacteria, you will be given an antibiotic medicine. If your condition is caused by a fungus, you will be given an antifungal medicine. Surgery may be needed to correct underlying conditions, such as narrow nasal passages. Surgery may also be needed to remove polyps. Follow these instructions at home: Medicines   Take, use, or apply over-the-counter and prescription medicines only as told by your health care provider. These may include nasal sprays.  If you were prescribed an antibiotic medicine, take it as told by your health care provider. Do not stop taking the antibiotic even if you start to feel better. Hydrate and Humidify   Drink enough water to keep your urine clear or pale yellow. Staying hydrated will help to thin your mucus.  Use a cool mist humidifier to keep the humidity level in your home above 50%.  Inhale steam for 10-15 minutes, 3-4 times a day or as told by your health care provider. You can do this in the bathroom while a hot shower is running.  Limit your exposure to cool or dry air. Rest   Rest as much as possible.  Sleep with your head raised (elevated).  Make sure to get enough sleep each night. General instructions   Apply a warm, moist washcloth to your face 3-4 times a day or as told by your health care provider. This will help with discomfort.  Wash your hands often with soap and water to reduce your exposure to viruses and other germs. If soap and water are not available, use hand sanitizer.  Do not smoke. Avoid being around people who are smoking (secondhand smoke).  Keep all follow-up visits as told by your health care provider. This is important. Contact a health care provider if:  You  have a fever.  Your symptoms get worse.  Your symptoms do not improve within 10 days. Get help right away if:  You have a severe headache.  You have persistent vomiting.  You have pain or swelling around your face or eyes.  You have vision problems.  You develop confusion.  Your neck is stiff.  You have trouble breathing. This information is not intended to replace advice given to you by your health care provider. Make sure you discuss any questions you have with your health care provider. Document Released: 12/18/2004 Document Revised: 08/14/2015 Document Reviewed: 10/13/2014 [employer] Interactive Patient Education  2017 [employer]

## 2016-03-28 NOTE — Progress Notes (Signed)
Kathleen Garcia is a 66 y.o. female who [facility] today for sinus pain and pressure associated with sore throat and postnasal drainage. Symptoms present for 3 days. Patient has tried Zyrtec as well as some over-the-counter medications which have helped some.   Past Medical History:  Diagnosis Date  . Cervical spondylosis     with disk protrusion  . Chest pain   . Diarrhea    The history was provided by the patient  . Fluttering heart   . Irregular heart rate   . Irritable bowel syndrome   . Osteomyelitis (HCC)    middle phalanx, right index finger  . Osteoporosis   . Palpitation   . Trigeminal neuralgia   . Vomiting    The history was provided by the patient   Past Surgical History:  Procedure Laterality Date  . CERVICAL DISC SURGERY    . COLONOSCOPY     with biopsies  . KNEE SURGERY    . TONSILLECTOMY    . TUBAL LIGATION    . UPPER GASTROINTESTINAL ENDOSCOPY      with biopsies   Social History  Substance Use Topics  . Smoking status: Never Smoker  . Smokeless tobacco: Not on file  . Alcohol use No   family history includes Coronary artery disease in her father; Hypertension in her mother.  ROS as above:  Medications: Current Outpatient Prescriptions  Medication Sig Dispense Refill  . BYSTOLIC 5 MG tablet TAKE 1 TABLET BY MOUTH DAILY 90 tablet 0  . CALCIUM PO Take 1,500 mg by mouth daily.     . cetirizine (ZYRTEC) 10 MG tablet Take 10 mg by mouth daily.      . chlorpheniramine-HYDROcodone (TUSSIONEX) 10-8 MG/5ML SUER Take 5 mLs by mouth every 12 (twelve) hours as needed for cough (cough, will cause drowsiness.). 120 mL 0  . Cholecalciferol (VITAMIN D PO) Take 2,000 Units by mouth daily.     Marland Kitchen. DEXILANT 60 MG capsule TAKE ONE CAPSULE BY MOUTH EVERY DAY 90 capsule 3  . DEXILANT 60 MG capsule TAKE ONE CAPSULE BY MOUTH EVERY DAY 90  capsule 3  . fluticasone (FLONASE) 50 MCG/ACT nasal spray One spray in each nostril twice a day, use left hand for right nostril, and right hand for left nostril. (Patient taking differently: Place 2 sprays into both nostrils as needed for allergies or rhinitis. One spray in each nostril twice a day, use left hand for right nostril, and right hand for left nostril.) 48 g 3  . gabapentin (NEURONTIN) 300 MG capsule USE AS DIRECTED 180 capsule 1  . hyoscyamine (LEVSIN, ANASPAZ) 0.125 MG tablet Take 1 tablet (0.125 mg total) by mouth every 4 (four) hours as needed for cramping. 90 tablet 3  . LORazepam (ATIVAN) 0.5 MG tablet Take 1 tablet (0.5 mg total) by mouth as needed for anxiety. 30 tablet 3  . montelukast (SINGULAIR) 10 MG tablet TAKE 1 TABLET BY MOUTH EVERY MORNING 90 tablet 2  . cefdinir (OMNICEF) 250 MG/5ML suspension Take 6 mLs (300 mg total) by mouth 2 (two) times daily. 7 days 100 mL 0  . ipratropium (ATROVENT) 0.06 % nasal spray Place 2 sprays into both nostrils every 4 (four) hours as needed for rhinitis. 10 mL 6  . predniSONE (DELTASONE) 10 MG tablet Take 3 tablets (30 mg total) by mouth daily with breakfast. 15 tablet 0   No current facility-administered medications for this  visit.    Allergies  Allergen Reactions  . Morphine Anaphylaxis    Swelling   . Azithromycin Nausea And Vomiting  . Betadine [Povidone Iodine]   . Keflex [Cephalexin] Nausea And Vomiting  . Povidone-Iodine   . Promethazine Hcl   . Xarelto [Rivaroxaban] Other (See Comments)    Severe nausea  . Septra [Sulfamethoxazole-Trimethoprim] Rash    Health Maintenance Health Maintenance  Topic Date Due  . Hepatitis C Screening  1950/08/19  . HIV Screening  11/07/1965  . PNA vac Low Risk Adult (1 of 2 - PCV13) 11/08/2015  . COLONOSCOPY  01/02/2016  . INFLUENZA VACCINE  02/27/2017 (Originally 08/02/2015)  . MAMMOGRAM  05/01/2017  . PAP SMEAR  07/18/2017  . TETANUS/TDAP  01/02/2019  . DEXA SCAN  Completed      Exam:  BP (!) 122/50   Pulse 76   Wt 138 lb (62.6 kg)   BMI 26.07 kg/m  Gen: Well NAD HEENT: EOMI,  MMM Posterior pharynx with cobblestoning. Tender palpation frontal sinuses bilaterally. Normal tympanic membranes bilaterally. Inflamed nasal turbinates bilaterally. Lungs: Normal work of breathing. CTABL Heart: RRR no MRG Abd: NABS, Soft. Nondistended, Nontender Exts: Brisk capillary refill, warm and well perfused.    No results found for this or any previous visit (from the past 72 hour(s)). No results found.    Assessment and Plan: 66 y.o. female with this likely viral initially. Use over-the-counter medications along with Atrovent nasal spray. Use prednisone and Omnicef is back up if not better.   No orders of the defined types were placed in this encounter.  Meds ordered this encounter  Medications  . predniSONE (DELTASONE) 10 MG tablet    Sig: Take 3 tablets (30 mg total) by mouth daily with breakfast.    Dispense:  15 tablet    Refill:  0  . cefdinir (OMNICEF) 250 MG/5ML suspension    Sig: Take 6 mLs (300 mg total) by mouth 2 (two) times daily. 7 days    Dispense:  100 mL    Refill:  0  . ipratropium (ATROVENT) 0.06 % nasal spray    Sig: Place 2 sprays into both nostrils every 4 (four) hours as needed for rhinitis.    Dispense:  10 mL    Refill:  6     Discussed warning signs or symptoms. Please see discharge instructions. Patient expresses understanding.

## 2016-04-06 ENCOUNTER — Ambulatory Visit (INDEPENDENT_AMBULATORY_CARE_PROVIDER_SITE_OTHER): Payer: BC Managed Care – PPO | Admitting: Sports Medicine

## 2016-04-06 ENCOUNTER — Encounter: Payer: Self-pay | Admitting: Sports Medicine

## 2016-04-06 DIAGNOSIS — L304 Erythema intertrigo: Secondary | ICD-10-CM | POA: Insufficient documentation

## 2016-04-06 DIAGNOSIS — M67441 Ganglion, right hand: Secondary | ICD-10-CM

## 2016-04-06 MED ORDER — CLOTRIMAZOLE-BETAMETHASONE 1-0.05 % EX CREA: 1.0000 "application " | TOPICAL_CREAM | Freq: Two times a day (BID) | CUTANEOUS | 0 refills | Status: DC

## 2016-04-06 NOTE — Assessment & Plan Note (Signed)
I'm not totally sure what this is, it does have the distribution of intertrigo but doesn't have the classic reddish plaques. No new topical agents or cleaners or medications. Starting Lotrisone, return in 2 weeks to recheck.

## 2016-04-06 NOTE — Progress Notes (Signed)
  Subjective:    CC: Skin rash, finger pain  HPI: This is a pleasant 66 year old female, for the past several weeks she's had increasing rash under her breasts, and anterior abdomen, pruritic, it tends to break out and get more red, and then will resolve.  No new medications, topical agents, cleaners.  She also has pain in her right middle finger, there is a visible mass over the volar middle phalanx.  Past medical history:  Negative.  See flowsheet/record as well for more information.  Surgical history: Negative.  See flowsheet/record as well for more information.  Family history: Negative.  See flowsheet/record as well for more information.  Social history: Negative.  See flowsheet/record as well for more information.  Allergies, and medications have been entered into the medical record, reviewed, and no changes needed.   Review of Systems: No fevers, chills, night sweats, weight loss, chest pain, or shortness of breath.   Objective:    General: Well Developed, well nourished, and in no acute distress.  Neuro: Alert and oriented x3, extra-ocular muscles intact, sensation grossly intact.  HEENT: Normocephalic, atraumatic, pupils equal round reactive to light, neck supple, no masses, no lymphadenopathy, thyroid nonpalpable.  Skin: Warm and dry, There is a papular type rash without surrounding erythema under the breasts and on the anterior abdomen. No signs of bacterial superinfection or excoriations. Cardiac: Regular rate and rhythm, no murmurs rubs or gallops, no lower extremity edema.  Respiratory: Clear to auscultation bilaterally. Not using accessory muscles, speaking in full sentences. Right hand: Ganglion cyst over the volar middle failings of the third finger  Impression and Recommendations:    Intertrigo I'm not totally sure what this is, it does have the distribution of intertrigo but doesn't have the classic reddish plaques. No new topical agents or cleaners or  medications. Starting Lotrisone, return in 2 weeks to recheck.  Ganglion cyst of flexor tendon sheath of finger of right hand Third finger ganglion cyst, return in 2 weeks and if still present we will drain and inject.  I spent 25 minutes with this patient, greater than 50% was face-to-face time counseling regarding the above diagnoses

## 2016-04-06 NOTE — Addendum Note (Signed)
Addended by: Baird Kay on: 04/06/2016 03:27 PM   Modules accepted: Orders

## 2016-04-06 NOTE — Assessment & Plan Note (Signed)
Third finger ganglion cyst, return in 2 weeks and if still present we will drain and inject.

## 2016-04-17 ENCOUNTER — Ambulatory Visit (INDEPENDENT_AMBULATORY_CARE_PROVIDER_SITE_OTHER): Payer: BC Managed Care – PPO | Admitting: Sports Medicine

## 2016-04-17 ENCOUNTER — Encounter: Payer: Self-pay | Admitting: Sports Medicine

## 2016-04-17 DIAGNOSIS — M25532 Pain in left wrist: Secondary | ICD-10-CM | POA: Diagnosis not present

## 2016-04-17 DIAGNOSIS — L304 Erythema intertrigo: Secondary | ICD-10-CM

## 2016-04-17 DIAGNOSIS — M67441 Ganglion, right hand: Secondary | ICD-10-CM

## 2016-04-17 NOTE — Assessment & Plan Note (Signed)
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 She can use Motrin over-the-counter and do some rehabilitation exercises.

## 2016-04-17 NOTE — Progress Notes (Signed)
  Subjective:    CC: Follow-up  HPI: Intertrigo: Resolved and then recurred.  Ganglion cyst: Essentially resolved, painless.  Left wrist pain: Localized over the ulna, worse with grasping and twisting-type motions, no trauma, no change in activity. No mechanical symptoms.  Past medical history:  Negative.  See flowsheet/record as well for more information.  Surgical history: Negative.  See flowsheet/record as well for more information.  Family history: Negative.  See flowsheet/record as well for more information.  Social history: Negative.  See flowsheet/record as well for more information.  Allergies, and medications have been entered into the medical record, reviewed, and no changes needed.   Review of Systems: No fevers, chills, night sweats, weight loss, chest pain, or shortness of breath.   Objective:    General: Well Developed, well nourished, and in no acute distress.  Neuro: Alert and oriented x3, extra-ocular muscles intact, sensation grossly intact.  HEENT: Normocephalic, atraumatic, pupils equal round reactive to light, neck supple, no masses, no lymphadenopathy, thyroid nonpalpable.  Skin: Warm and dry, no rashes. Cardiac: Regular rate and rhythm, no murmurs rubs or gallops, no lower extremity edema.  Respiratory: Clear to auscultation bilaterally. Not using accessory muscles, speaking in full sentences. Left Wrist: Inspection normal with no visible erythema or swelling. ROM smooth and normal with good flexion and extension and ulnar/radial deviation that is symmetrical with opposite wrist. Palpation is normal over metacarpals, navicular, lunate, and TFCC; minimal fullness over the sixth extensor compartment, with minimal tenderness over the extensor carpi ulnaris and the flexor carpi ulnaris. No snuffbox tenderness. No tenderness over Canal of Gu[place]. Strength 5/5 in all directions without pain. Negative tinel's and phalens signs. Negative Finkelstein sign. Negative  Watson's test.  Impression and Recommendations:    Ganglion cyst of flexor tendon sheath of finger of right hand Improving considerably, doesn't really hurt anymore. I can see this as needed.  Intertrigo Resolved while using the cream twice a day, she dropped it to daily and it recurred. She will increase to twice a day for another 2 weeks and then stop.  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 She can use Motrin over-the-counter and do some rehabilitation exercises.

## 2016-04-17 NOTE — Assessment & Plan Note (Signed)
Improving considerably, doesn't really hurt anymore. I can see this as needed.

## 2016-04-17 NOTE — Assessment & Plan Note (Signed)
Resolved while using the cream twice a day, she dropped it to daily and it recurred. She will increase to twice a day for another 2 weeks and then stop.

## 2016-05-01 LAB — HM COLONOSCOPY

## 2016-05-07 ENCOUNTER — Ambulatory Visit
Admission: RE | Admit: 2016-05-07 | Discharge: 2016-05-07 | Disposition: A | Discharge status: Home / Self Care | Payer: BC Managed Care – PPO | Source: Ambulatory Visit | Attending: Obstetrics and Gynecology | Admitting: Obstetrics and Gynecology

## 2016-05-07 DIAGNOSIS — Z1231 Encounter for screening mammogram for malignant neoplasm of breast: Secondary | ICD-10-CM

## 2016-06-17 ENCOUNTER — Other Ambulatory Visit: Payer: Self-pay | Admitting: Sports Medicine

## 2016-06-22 ENCOUNTER — Other Ambulatory Visit: Payer: Self-pay | Admitting: Sports Medicine

## 2016-06-22 DIAGNOSIS — R002 Palpitations: Secondary | ICD-10-CM

## 2016-07-15 ENCOUNTER — Other Ambulatory Visit: Payer: Self-pay | Admitting: Sports Medicine

## 2016-07-17 ENCOUNTER — Other Ambulatory Visit: Payer: Self-pay | Admitting: Sports Medicine

## 2016-07-17 MED ORDER — GABAPENTIN 100 MG PO CAPS: 100.0000 mg | ORAL_CAPSULE | Freq: Every day | ORAL | 3 refills | Status: DC | PRN

## 2016-07-20 ENCOUNTER — Telehealth: Payer: Self-pay

## 2016-07-20 MED ORDER — GABAPENTIN 300 MG PO CAPS: 300.0000 mg | ORAL_CAPSULE | Freq: Every day | ORAL | 3 refills | Status: DC

## 2016-07-20 NOTE — Telephone Encounter (Signed)
Medication change made. 

## 2016-07-20 NOTE — Telephone Encounter (Signed)
Patient states she is taking Gabapentin 300 mg once daily instead of the 100 mg. Please advise.

## 2016-07-20 NOTE — Telephone Encounter (Signed)
Patient aware.

## 2016-08-01 LAB — COLOGUARD: Cologuard: NEGATIVE

## 2016-08-01 LAB — HM PAP SMEAR: HM PAP: NEGATIVE

## 2016-08-15 ENCOUNTER — Telehealth: Payer: Self-pay

## 2016-08-15 NOTE — Telephone Encounter (Signed)
Pt states she has gotten into the same chiggers that her husband was recently seen for. Would like to know if you will send her in some prednisone. Please advise.

## 2016-08-17 ENCOUNTER — Encounter: Payer: Self-pay | Admitting: Sports Medicine

## 2016-08-17 MED ORDER — PREDNISONE 50 MG PO TABS: 50.0000 mg | ORAL_TABLET | Freq: Every day | ORAL | 0 refills | Status: DC

## 2016-08-17 MED ORDER — DOXYCYCLINE HYCLATE 100 MG PO TABS: 100.0000 mg | ORAL_TABLET | Freq: Two times a day (BID) | ORAL | 0 refills | Status: AC

## 2016-08-17 MED ORDER — TRIAMCINOLONE ACETONIDE 0.5 % EX OINT: 1.0000 "application " | TOPICAL_OINTMENT | Freq: Two times a day (BID) | CUTANEOUS | 3 refills | Status: DC

## 2016-08-22 NOTE — Telephone Encounter (Signed)
I did back on the 17th.

## 2016-09-05 ENCOUNTER — Encounter: Payer: Self-pay | Admitting: Sports Medicine

## 2016-09-11 ENCOUNTER — Ambulatory Visit (INDEPENDENT_AMBULATORY_CARE_PROVIDER_SITE_OTHER): Payer: Medicare Other | Admitting: Sports Medicine

## 2016-09-11 VITALS — BP 110/64 | HR 65 | Resp 16 | Wt 136.3 lb

## 2016-09-11 DIAGNOSIS — D692 Other nonthrombocytopenic purpura: Secondary | ICD-10-CM | POA: Diagnosis not present

## 2016-09-11 DIAGNOSIS — Z23 Encounter for immunization: Secondary | ICD-10-CM

## 2016-09-11 DIAGNOSIS — Z0000 Encounter for general adult medical examination without abnormal findings: Secondary | ICD-10-CM

## 2016-09-11 NOTE — Assessment & Plan Note (Signed)
Discussed how common easy bruising was as we age, and as the skin thins. I add a CBC as well as coagulation factors, she simply needs to be more careful, as well as have some degree of expectation of easy bruising..Marland Kitchen

## 2016-09-11 NOTE — Addendum Note (Signed)
Addended by: Baird KayUGLAS, Cedrick Partain M on: 09/11/2016 11:33 AM   Modules accepted: Orders

## 2016-09-11 NOTE — Assessment & Plan Note (Signed)
Adding lipids, A1c, HIV screening.  Pneumococcal 13 vaccine today, 23 in 1 year.  Declines flu shot.

## 2016-09-11 NOTE — Progress Notes (Signed)
  Subjective:    CC: Easy bruising  HPI: This is a pleasant 66 year old female, she is here after she developed 2 large bruises on the back of her legs, on her left leg she recalls swatting at a mosquito, a bruise developed afterwards, no pain. On her left posterior knee she really doesn't know what happened. These are improving, she doesn't have any GI bleeding symptoms, melena, hematochezia, hematemesis, no bleeding of the gums. Did have a history of pulmonary embolism but is off of all anticoagulants at this point.  Preventive measures: Due for some routine blood work, declines flu vaccine, due for pneumococcal 13, she'll get the 23 in a year.  Past medical history:  Negative.  See flowsheet/record as well for more information.  Surgical history: Negative.  See flowsheet/record as well for more information.  Family history: Negative.  See flowsheet/record as well for more information.  Social history: Negative.  See flowsheet/record as well for more information.  Allergies, and medications have been entered into the medical record, reviewed, and no changes needed.   Review of Systems: No fevers, chills, night sweats, weight loss, chest pain, or shortness of breath.   Objective:    General: Well Developed, well nourished, and in no acute distress.  Neuro: Alert and oriented x3, extra-ocular muscles intact, sensation grossly intact.  HEENT: Normocephalic, atraumatic, pupils equal round reactive to light, neck supple, no masses, no lymphadenopathy, thyroid nonpalpable.  Skin: Warm and dry, no rashes. Cardiac: Regular rate and rhythm, no murmurs rubs or gallops, no lower extremity edema.  Respiratory: Clear to auscultation bilaterally. Not using accessory muscles, speaking in full sentences. Legs: There are 2 ecchymoses about 4-5 inches across on the posterior aspect of both knees, these are nontender, no collections, no lower extreme swelling, she does have significant varicosity of her  veins as well as multiple spider veins.  Impression and Recommendations:    Senile purpura (HCC) Discussed how common easy bruising was as we age, and as the skin thins. I add a CBC as well as coagulation factors, she simply needs to be more careful, as well as have some degree of expectation of easy bruising..  Annual physical exam Adding lipids, A1c, HIV screening.  Pneumococcal 13 vaccine today, 23 in 1 year.  Declines flu shot.  ___________________________________________ Ihor Austinhomas J. Benjamin Stainhekkekandam, M.D., ABFM., CAQSM. Primary Care and Sports Medicine  [facility]  Adjunct Instructor of Family Medicine  [employer]

## 2016-09-12 LAB — HIV ANTIBODY (ROUTINE TESTING W REFLEX): HIV 1&2 Ab, 4th Generation: NONREACTIVE

## 2016-09-12 LAB — CBC
HCT: 36.3 % (ref 35.0–45.0)
Hemoglobin: 12.4 g/dL (ref 11.7–15.5)
MCH: 30 pg (ref 27.0–33.0)
MCHC: 34.2 g/dL (ref 32.0–36.0)
MCV: 87.9 fL (ref 80.0–100.0)
MPV: 9.9 fL (ref 7.5–12.5)
Platelets: 303 Thousand/uL (ref 140–400)
RBC: 4.13 10*6/uL (ref 3.80–5.10)
RDW: 13.1 % (ref 11.0–15.0)
WBC: 5.6 Thousand/uL (ref 3.8–10.8)

## 2016-09-12 LAB — PROTIME-INR
INR: 1
Prothrombin Time: 10.2 s (ref 9.0–11.5)

## 2016-09-12 LAB — COMPREHENSIVE METABOLIC PANEL
AG Ratio: 1.5 (calc) (ref 1.0–2.5)
AST: 19 U/L (ref 10–35)
Albumin: 4.2 g/dL (ref 3.6–5.1)
CO2: 28 mmol/L (ref 20–32)
Chloride: 102 mmol/L (ref 98–110)
Globulin: 2.8 g/dL (calc) (ref 1.9–3.7)
Glucose, Bld: 91 mg/dL (ref 65–99)
Potassium: 4.3 mmol/L (ref 3.5–5.3)
Sodium: 139 mmol/L (ref 135–146)

## 2016-09-12 LAB — LIPID PANEL W/REFLEX DIRECT LDL
Cholesterol: 176 mg/dL (ref <200)
HDL: 72 mg/dL (ref >50)
LDL Cholesterol (Calc): 86 mg/dL
Non-HDL Cholesterol (Calc): 104 mg/dL (calc) (ref <130)
Total CHOL/HDL Ratio: 2.4 (calc) (ref <5.0)
Triglycerides: 85 mg/dL (ref <150)

## 2016-09-12 LAB — COMPREHENSIVE METABOLIC PANEL WITH GFR
ALT: 11 U/L (ref 6–29)
Alkaline phosphatase (APISO): 74 U/L (ref 33–130)
BUN: 15 mg/dL (ref 7–25)
Calcium: 9.4 mg/dL (ref 8.6–10.4)
Creat: 0.77 mg/dL (ref 0.50–0.99)
Total Bilirubin: 0.4 mg/dL (ref 0.2–1.2)
Total Protein: 7 g/dL (ref 6.1–8.1)

## 2016-09-12 LAB — APTT: aPTT: 29 s (ref 22–34)

## 2016-09-12 LAB — TSH: TSH: 0.99 mIU/L (ref 0.40–4.50)

## 2016-09-12 LAB — HEMOGLOBIN A1C
Hgb A1c MFr Bld: 5.4 % of total Hgb (ref <5.7)
Mean Plasma Glucose: 108 (calc)
eAG (mmol/L): 6 (calc)

## 2016-09-12 LAB — VITAMIN D 25 HYDROXY (VIT D DEFICIENCY, FRACTURES): Vit D, 25-Hydroxy: 53 ng/mL (ref 30–100)

## 2016-09-14 ENCOUNTER — Other Ambulatory Visit: Payer: Self-pay | Admitting: Sports Medicine

## 2016-10-08 ENCOUNTER — Encounter: Payer: Self-pay | Admitting: Sports Medicine

## 2016-10-13 ENCOUNTER — Other Ambulatory Visit: Payer: Self-pay | Admitting: Sports Medicine

## 2016-10-13 DIAGNOSIS — R002 Palpitations: Secondary | ICD-10-CM

## 2016-10-23 ENCOUNTER — Ambulatory Visit (INDEPENDENT_AMBULATORY_CARE_PROVIDER_SITE_OTHER): Payer: Medicare Other | Admitting: Sports Medicine

## 2016-10-23 ENCOUNTER — Encounter: Payer: Self-pay | Admitting: Sports Medicine

## 2016-10-23 DIAGNOSIS — M722 Plantar fascial fibromatosis: Secondary | ICD-10-CM | POA: Diagnosis not present

## 2016-10-23 MED ORDER — MAGNESIUM OXIDE 400 MG PO TABS: 800.0000 mg | ORAL_TABLET | Freq: Every day | ORAL | 3 refills | Status: DC

## 2016-10-23 NOTE — Progress Notes (Signed)
  Subjective:    CC: Bilateral foot pain  HPI: For the past several months Kathleen Garcia has had moderate pain that she localizes on the plantar aspect of her left foot, milder in the right foot, as well as some cramping in the left calf at night.  Symptoms are moderate, persistent.  Past medical history:  Negative.  See flowsheet/record as well for more information.  Surgical history: Negative.  See flowsheet/record as well for more information.  Family history: Negative.  See flowsheet/record as well for more information.  Social history: Negative.  See flowsheet/record as well for more information.  Allergies, and medications have been entered into the medical record, reviewed, and no changes needed.   Review of Systems: No fevers, chills, night sweats, weight loss, chest pain, or shortness of breath.   Objective:    General: Well Developed, well nourished, and in no acute distress.  Neuro: Alert and oriented x3, extra-ocular muscles intact, sensation grossly intact.  HEENT: Normocephalic, atraumatic, pupils equal round reactive to light, neck supple, no masses, no lymphadenopathy, thyroid nonpalpable.  Skin: Warm and dry, no rashes. Cardiac: Regular rate and rhythm, no murmurs rubs or gallops, no lower extremity edema.  Respiratory: Clear to auscultation bilaterally. Not using accessory muscles, speaking in full sentences. Left foot: No visible erythema or swelling. Range of motion is full in all directions. Strength is 5/5 in all directions. No hallux valgus. No pes cavus or pes planus. No abnormal callus noted. No pain over the navicular prominence, or base of fifth metatarsal. Mild tenderness palpation of the calcaneal insertion of plantar fascia. No pain at the Achilles insertion. No pain over the calcaneal bursa. No pain of the retrocalcaneal bursa. No tenderness to palpation over the tarsals, metatarsals, or phalanges. No hallux rigidus or limitus. No tenderness palpation over  interphalangeal joints. No pain with compression of the metatarsal heads. Neurovascularly intact distally.  Impression and Recommendations:    Plantar fasciitis, bilateral Home rehabilitation exercises, nighttime splinting. She did have custom orthotics made by Dr. Althea CharonMcKinley, feel like these were comfortable so we will avoid orthotics for now. Return in one month, injection if no better. Left worse than right. Also during some bilateral nocturnal calf cramping, likely related to lumbar spinal stenosis versus simple muscle cramp. Starting with magnesium oxide bedtime.  I spent 25 minutes with this patient, greater than 50% was face-to-face time counseling regarding the above diagnoses ___________________________________________ Ihor Austinhomas J. Benjamin Stainhekkekandam, M.D., ABFM., CAQSM. Primary Care and Sports Medicine [place] [facility]  Adjunct Instructor of Family Medicine  [employer]

## 2016-10-23 NOTE — Assessment & Plan Note (Signed)
Home rehabilitation exercises, nighttime splinting. She did have custom orthotics made by Dr. Althea CharonMcKinley, feel like these were comfortable so we will avoid orthotics for now. Return in one month, injection if no better. Left worse than right. Also during some bilateral nocturnal calf cramping, likely related to lumbar spinal stenosis versus simple muscle cramp. Starting with magnesium oxide bedtime.

## 2016-11-06 ENCOUNTER — Encounter: Payer: Self-pay | Admitting: Sports Medicine

## 2016-11-06 DIAGNOSIS — M722 Plantar fascial fibromatosis: Secondary | ICD-10-CM

## 2016-11-06 NOTE — Telephone Encounter (Signed)
Done! ___________________________________________ Thomas J. Thekkekandam, M.D., ABFM., CAQSM. Primary Care and Sports Medicine [facility]  Adjunct Instructor of Family Medicine  [employer]   

## 2016-11-20 ENCOUNTER — Ambulatory Visit: Payer: Medicare Other | Admitting: Sports Medicine

## 2016-11-28 ENCOUNTER — Other Ambulatory Visit: Payer: Self-pay | Admitting: Sports Medicine

## 2016-11-30 ENCOUNTER — Ambulatory Visit: Payer: Medicare Other | Admitting: Sports Medicine

## 2016-11-30 ENCOUNTER — Encounter: Payer: Self-pay | Admitting: Sports Medicine

## 2016-11-30 DIAGNOSIS — R0982 Postnasal drip: Secondary | ICD-10-CM

## 2016-11-30 MED ORDER — HYDROCOD POLST-CPM POLST ER 10-8 MG/5ML PO SUER: 5.0000 mL | Freq: Two times a day (BID) | ORAL | 0 refills | Status: DC | PRN

## 2016-11-30 NOTE — Assessment & Plan Note (Signed)
This is likely causing the cough without acute bronchitis or sinus infection. Tussionex at night, daytime TheraFlu, Flonase. Return if no better in a week.

## 2016-11-30 NOTE — Progress Notes (Signed)
  Subjective:    CC: Coughing  HPI: For a week this pleasant 66 year old female has had a cough productive of yellowish sputum, significant nasal stuffiness and postnasal drip with a sore throat, no fevers or chills, no GI symptoms, no rashes.  No shortness of breath,, no myalgias or arthralgias.  Symptoms are moderate, persistent.  Past medical history:  Negative.  See flowsheet/record as well for more information.  Surgical history: Negative.  See flowsheet/record as well for more information.  Family history: Negative.  See flowsheet/record as well for more information.  Social history: Negative.  See flowsheet/record as well for more information.  Allergies, and medications have been entered into the medical record, reviewed, and no changes needed.   Review of Systems: No fevers, chills, night sweats, weight loss, chest pain, or shortness of breath.   Objective:    General: Well Developed, well nourished, and in no acute distress.  Neuro: Alert and oriented x3, extra-ocular muscles intact, sensation grossly intact.  HEENT: Normocephalic, atraumatic, pupils equal round reactive to light, neck supple, no masses, no lymphadenopathy, thyroid nonpalpable.  Oropharynx, ear canals, nasopharynx are unremarkable with the exception of mild oropharyngeal erythema Skin: Warm and dry, no rashes. Cardiac: Regular rate and rhythm, no murmurs rubs or gallops, no lower extremity edema.  Respiratory: Clear to auscultation bilaterally. Not using accessory muscles, speaking in full sentences.  Impression and Recommendations:    Postnasal drip This is likely causing the cough without acute bronchitis or sinus infection. Tussionex at night, daytime TheraFlu, Flonase. Return if no better in a week.  I spent 25 minutes with this patient, greater than 50% was face-to-face time counseling regarding the above diagnoses ___________________________________________ Ihor Austinhomas J. Benjamin Stainhekkekandam, M.D., ABFM.,  CAQSM. Primary Care and Sports Medicine [place] [facility]  Adjunct Instructor of Family Medicine  [employer]

## 2016-12-10 ENCOUNTER — Ambulatory Visit: Payer: Medicare Other | Admitting: Sports Medicine

## 2016-12-17 ENCOUNTER — Encounter: Payer: Self-pay | Admitting: Sports Medicine

## 2016-12-17 ENCOUNTER — Ambulatory Visit: Payer: Medicare Other | Admitting: Sports Medicine

## 2016-12-17 DIAGNOSIS — M722 Plantar fascial fibromatosis: Secondary | ICD-10-CM

## 2016-12-17 NOTE — Progress Notes (Signed)
Subjective:    CC: Bilateral foot pain  HPI: This is a pleasant 66 year old female, she has bilateral plantar fasciitis.  She also had some calf cramping that resolved with nighttime magnesium.  She has been working with physical therapy and has noted good improvement in her pain, and does continue to wear her custom orthotics.  Unfortunately she still has discomfort that affects her activities of daily living.  Moderate, persistent, localized bilaterally at the calcaneal origin of the plantar fascia, left worse than right.  Past medical history:  Negative.  See flowsheet/record as well for more information.  Surgical history: Negative.  See flowsheet/record as well for more information.  Family history: Negative.  See flowsheet/record as well for more information.  Social history: Negative.  See flowsheet/record as well for more information.  Allergies, and medications have been entered into the medical record, reviewed, and no changes needed.   (To billers/coders, pertinent past medical, social, surgical, family history can be found in problem list, if problem list is marked as reviewed then this indicates that past medical, social, surgical, family history was also reviewed)  Review of Systems: No fevers, chills, night sweats, weight loss, chest pain, or shortness of breath.   Objective:    General: Well Developed, well nourished, and in no acute distress.  Neuro: Alert and oriented x3, extra-ocular muscles intact, sensation grossly intact.  HEENT: Normocephalic, atraumatic, pupils equal round reactive to light, neck supple, no masses, no lymphadenopathy, thyroid nonpalpable.  Skin: Warm and dry, no rashes. Cardiac: Regular rate and rhythm, no murmurs rubs or gallops, no lower extremity edema.  Respiratory: Clear to auscultation bilaterally. Not using accessory muscles, speaking in full sentences. Bilateral feet: No visible erythema or swelling. Range of motion is full in all  directions. Strength is 5/5 in all directions. No hallux valgus. No pes cavus or pes planus. No abnormal callus noted. No pain over the navicular prominence, or base of fifth metatarsal. Moderate tenderness to palpation of the calcaneal insertion of plantar fascia. No pain at the Achilles insertion. No pain over the calcaneal bursa. No pain of the retrocalcaneal bursa. No tenderness to palpation over the tarsals, metatarsals, or phalanges. No hallux rigidus or limitus. No tenderness palpation over interphalangeal joints. No pain with compression of the metatarsal heads. Neurovascularly intact distally.  Procedure: Real-time Ultrasound Guided Injection of left plantar fascial origin Device: GE Logiq E  Verbal informed consent obtained.  Time-out conducted.  Noted no overlying erythema, induration, or other signs of local infection.  Skin prepped in a sterile fashion.  Local anesthesia: Topical Ethyl chloride.  With sterile technique and under real time ultrasound guidance: 1 cc kenalog 40, 1 cc lidocaine, 1 cc bupivacaine injected easily. Completed without difficulty  Pain immediately resolved suggesting accurate placement of the medication.  Advised to call if fevers/chills, erythema, induration, drainage, or persistent bleeding.  Images permanently stored and available for review in the ultrasound unit.  Impression: Technically successful ultrasound guided injection.  Procedure: Real-time Ultrasound Guided Injection of right plantar fascial origin Device: GE Logiq E  Verbal informed consent obtained.  Time-out conducted.  Noted no overlying erythema, induration, or other signs of local infection.  Skin prepped in a sterile fashion.  Local anesthesia: Topical Ethyl chloride.  With sterile technique and under real time ultrasound guidance: 1 cc kenalog 40, 1 cc lidocaine, 1 cc bupivacaine injected easily. Completed without difficulty  Pain immediately resolved suggesting  accurate placement of the medication.  Advised to call if fevers/chills, erythema,  induration, drainage, or persistent bleeding.  Images permanently stored and available for review in the ultrasound unit.  Impression: Technically successful ultrasound guided injection.  Impression and Recommendations:    Plantar fasciitis, bilateral Improved slightly with physical therapy, some pain still, bilateral plantar fascia injections. Muscle cramping has resolved with magnesium oxide. Return in 1 month, continue custom orthotics. ___________________________________________ Ihor Austinhomas J. Benjamin Stainhekkekandam, M.D., ABFM., CAQSM. Primary Care and Sports Medicine [facility]  Adjunct Instructor of Family Medicine  [employer]

## 2016-12-17 NOTE — Assessment & Plan Note (Signed)
Improved slightly with physical therapy, some pain still, bilateral plantar fascia injections. Muscle cramping has resolved with magnesium oxide. Return in 1 month, continue custom orthotics.

## 2016-12-18 ENCOUNTER — Other Ambulatory Visit: Payer: Self-pay | Admitting: Sports Medicine

## 2016-12-18 DIAGNOSIS — R002 Palpitations: Secondary | ICD-10-CM

## 2017-01-14 ENCOUNTER — Encounter: Payer: Self-pay | Admitting: Sports Medicine

## 2017-01-14 ENCOUNTER — Ambulatory Visit: Payer: Medicare Other | Admitting: Sports Medicine

## 2017-01-14 DIAGNOSIS — M722 Plantar fascial fibromatosis: Secondary | ICD-10-CM

## 2017-01-14 DIAGNOSIS — R002 Palpitations: Secondary | ICD-10-CM | POA: Diagnosis not present

## 2017-01-14 DIAGNOSIS — K219 Gastro-esophageal reflux disease without esophagitis: Secondary | ICD-10-CM

## 2017-01-14 MED ORDER — ESOMEPRAZOLE MAGNESIUM 40 MG PO CPDR: DELAYED_RELEASE_CAPSULE | ORAL | 3 refills | Status: DC

## 2017-01-14 MED ORDER — METOPROLOL TARTRATE 25 MG PO TABS: 25.0000 mg | ORAL_TABLET | Freq: Two times a day (BID) | ORAL | 3 refills | Status: DC

## 2017-01-14 NOTE — Assessment & Plan Note (Signed)
Dexilant is now too expensive, switching back to Nexium.

## 2017-01-14 NOTE — Assessment & Plan Note (Signed)
Did well on Bystolic but unfortunately is now too expensive. Switching back to metoprolol. We will start with standard release metoprolol 25 mg twice a day and increase as needed.

## 2017-01-14 NOTE — Assessment & Plan Note (Signed)
Fantastic improvements after bilateral plantar fascia injections, physical therapy, custom orthotics. Continue PT for another 3-4 weeks. Pain is approximately 80% better. Return to see me as needed in 4 weeks.

## 2017-01-14 NOTE — Progress Notes (Signed)
Subjective:    CC: Recheck foot pain  HPI: This is a pleasant 67 year old female, I treated her for plantar fasciitis recently, we did bilateral plantar fascia injections and she has done well with custom orthotics, physical therapy.  Agreeable to do some more physical therapy, she has not noted a plateau in her relief.  Palpitations: Did well for a period of time with Bystolic, this is no longer covered, she is Medicare age now.  She is now agreeable to switch back to metoprolol.  GERD: Initially well controlled with Dexilant, again because she is Medicare age this is no longer approved, switching back to esomeprazole.  I reviewed the past medical history, family history, social history, surgical history, and allergies today and no changes were needed.  Please see the problem list section below in epic for further details.  Past Medical History: Past Medical History:  Diagnosis Date  . Cervical spondylosis     with disk protrusion  . Chest pain   . Diarrhea    The history was provided by the patient  . Fluttering heart   . Irregular heart rate   . Irritable bowel syndrome   . Osteomyelitis (HCC)    middle phalanx, right index finger  . Osteoporosis   . Palpitation   . Trigeminal neuralgia   . Vomiting    The history was provided by the patient   Past Surgical History: Past Surgical History:  Procedure Laterality Date  . CERVICAL DISC SURGERY    . COLONOSCOPY     with biopsies  . KNEE SURGERY    . TONSILLECTOMY    . TUBAL LIGATION    . UPPER GASTROINTESTINAL ENDOSCOPY      with biopsies   Social History: Social History   Socioeconomic History  . Marital status: Married    Spouse name: None  . Number of children: None  . Years of education: None  . Highest education level: None  Social Needs  . Financial resource strain: None  . Food insecurity - worry: None  . Food insecurity - inability: None  . Transportation needs - medical: None  . Transportation needs  - non-medical: None  Occupational History  . None  Tobacco Use  . Smoking status: Never Smoker  . Smokeless tobacco: Never Used  Substance and Sexual Activity  . Alcohol use: No  . Drug use: No  . Sexual activity: No    Birth control/protection: Surgical  Other Topics Concern  . None  Social History Narrative  . None   Family History: Family History  Problem Relation Age of Onset  . Hypertension Mother   . Coronary artery disease Father    Allergies: Allergies  Allergen Reactions  . Morphine Anaphylaxis    Swelling   . Azithromycin Nausea And Vomiting  . Betadine [Povidone Iodine]   . Keflex [Cephalexin] Nausea And Vomiting  . Povidone-Iodine   . Promethazine Hcl   . Xarelto [Rivaroxaban] Other (See Comments)    Severe nausea  . Septra [Sulfamethoxazole-Trimethoprim] Rash   Medications: See med rec.  Review of Systems: No fevers, chills, night sweats, weight loss, chest pain, or shortness of breath.   Objective:    General: Well Developed, well nourished, and in no acute distress.  Neuro: Alert and oriented x3, extra-ocular muscles intact, sensation grossly intact.  HEENT: Normocephalic, atraumatic, pupils equal round reactive to light, neck supple, no masses, no lymphadenopathy, thyroid nonpalpable.  Skin: Warm and dry, no rashes. Cardiac: Regular rate and rhythm, no murmurs  rubs or gallops, no lower extremity edema.  Respiratory: Clear to auscultation bilaterally. Not using accessory muscles, speaking in full sentences.  Impression and Recommendations:    Plantar fasciitis, bilateral Fantastic improvements after bilateral plantar fascia injections, physical therapy, custom orthotics. Continue PT for another 3-4 weeks. Pain is approximately 80% better. Return to see me as needed in 4 weeks.  Palpitations Did well on Bystolic but unfortunately is now too expensive. Switching back to metoprolol. We will start with standard release metoprolol 25 mg twice a  day and increase as needed.  GERD (gastroesophageal reflux disease) Dexilant is now too expensive, switching back to Nexium.  I spent 25 minutes with this patient, greater than 50% was face-to-face time counseling regarding the above diagnoses ___________________________________________ Ihor Austin. Benjamin Stain, M.D., ABFM., CAQSM. Primary Care and Sports Medicine [facility]  Adjunct Instructor of Family Medicine  [employer]

## 2017-01-31 ENCOUNTER — Encounter: Payer: Self-pay | Admitting: Sports Medicine

## 2017-02-11 ENCOUNTER — Encounter: Payer: Self-pay | Admitting: Sports Medicine

## 2017-02-11 ENCOUNTER — Ambulatory Visit: Payer: Medicare Other | Admitting: Sports Medicine

## 2017-03-05 ENCOUNTER — Other Ambulatory Visit: Payer: Self-pay | Admitting: Sports Medicine

## 2017-03-28 ENCOUNTER — Other Ambulatory Visit: Payer: Self-pay | Admitting: Obstetrics and Gynecology

## 2017-03-28 DIAGNOSIS — Z1231 Encounter for screening mammogram for malignant neoplasm of breast: Secondary | ICD-10-CM

## 2017-04-24 ENCOUNTER — Encounter: Payer: Self-pay | Admitting: Sports Medicine

## 2017-04-29 ENCOUNTER — Ambulatory Visit: Payer: Medicare Other | Admitting: Sports Medicine

## 2017-04-29 ENCOUNTER — Encounter: Payer: Self-pay | Admitting: Sports Medicine

## 2017-04-29 DIAGNOSIS — R55 Syncope and collapse: Secondary | ICD-10-CM

## 2017-04-29 DIAGNOSIS — M7752 Other enthesopathy of left foot: Secondary | ICD-10-CM

## 2017-04-29 DIAGNOSIS — M7751 Other enthesopathy of right foot: Secondary | ICD-10-CM | POA: Diagnosis not present

## 2017-04-29 MED ORDER — GABAPENTIN 300 MG PO CAPS: 300.0000 mg | ORAL_CAPSULE | Freq: Two times a day (BID) | ORAL | 3 refills | Status: DC

## 2017-04-29 NOTE — Progress Notes (Signed)
Subjective:    CC: Bilateral heel pain  HPI: This is a pleasant 67 year old female, we have been treating her for bilateral plantar fasciitis, she also had some orthotics made by Dr. Althea Charon at [facility] orth[facility].  Unfortunately she is having a recurrence/worsening of pain, this time localized over the posterior heel bilaterally, left worse than right, just deep to the Achilles insertion, moderate, persistent without radiation.  She also feels that the orthotics are a bit thick and are hurting her arch.  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  I reviewed the past medical history, family history, social history, surgical history, and allergies today and no changes were needed.  Please see the problem list section below in epic for further details.  Past Medical History: Past Medical History:  Diagnosis Date  . Cervical spondylosis     with disk protrusion  . Chest pain   . Diarrhea    The history was provided by the patient  . Fluttering heart   . Irregular heart rate   . Irritable bowel syndrome   . Osteomyelitis (HCC)    middle phalanx, right index finger  . Osteoporosis   . Palpitation   . Trigeminal neuralgia   . Vomiting    The history was provided by the patient   Past Surgical History: Past Surgical History:  Procedure Laterality Date  . CERVICAL DISC SURGERY    . COLONOSCOPY     with biopsies  . KNEE SURGERY    . TONSILLECTOMY    . TUBAL LIGATION    . UPPER GASTROINTESTINAL ENDOSCOPY      with biopsies   Social History: Social History   Socioeconomic History  . Marital status: Married    Spouse name: Not on file  . Number of children: Not on file  . Years of education: Not on file  . Highest education level: Not on file  Occupational History  . Not on file  Social Needs  . Financial resource strain:  Not on file  . Food insecurity:    Worry: Not on file    Inability: Not on file  . Transportation needs:    Medical: Not on file    Non-medical: Not on file  Tobacco Use  . Smoking status: Never Smoker  . Smokeless tobacco: Never Used  Substance and Sexual Activity  . Alcohol use: No  . Drug use: No  . Sexual activity: Never    Birth control/protection: Surgical  Lifestyle  . Physical activity:    Days per week: Not on file    Minutes per session: Not on file  . Stress: Not on file  Relationships  . Social connections:    Talks on phone: Not on file    Gets together: Not on file    Attends religious service: Not on file    Active member of club or organization: Not on file    Attends meetings of clubs or organizations: Not on file    Relationship status: Not on file  Other Topics Concern  . Not on file  Social History Narrative  . Not on file   Family History: Family History  Problem Relation Age of Onset  . Hypertension Mother   . Coronary artery disease Father    Allergies: Allergies  Allergen Reactions  . Morphine Anaphylaxis    Swelling   .  Azithromycin Nausea And Vomiting  . Betadine [Povidone Iodine]   . Keflex [Cephalexin] Nausea And Vomiting  . Povidone-Iodine   . Promethazine Hcl   . Xarelto [Rivaroxaban] Other (See Comments)    Severe nausea  . Septra [Sulfamethoxazole-Trimethoprim] Rash   Medications: See med rec.  Review of Systems: No fevers, chills, night sweats, weight loss, chest pain, or shortness of breath.   Objective:    General: Well Developed, well nourished, and in no acute distress.  Neuro: Alert and oriented x3, extra-ocular muscles intact, sensation grossly intact.  HEENT: Normocephalic, atraumatic, pupils equal round reactive to light, neck supple, no masses, no lymphadenopathy, thyroid nonpalpable.  Skin: Warm and dry, no rashes. Cardiac: Regular rate and rhythm, no murmurs rubs or gallops, no lower extremity edema.    Respiratory: Clear to auscultation bilaterally. Not using accessory muscles, speaking in full sentences. Left ankle: No visible erythema or swelling. Range of motion is full in all directions. Strength is 5/5 in all directions. Stable lateral and medial ligaments; squeeze test and kleiger test unremarkable; Talar dome nontender; No pain at base of 5th MT; No tenderness over cuboid; No tenderness over N spot or navicular prominence No tenderness on posterior aspects of lateral and medial malleolus No sign of peroneal tendon subluxations; Negative tarsal tunnel tinel's Able to walk 4 steps. Tender to palpation just deep to the Achilles insertion over the retrocalcaneal bursa.  Procedure: Real-time Ultrasound Guided Injection of left retrocalcaneal bursa Device: GE Logiq E  Verbal informed consent obtained.  Time-out conducted.  Noted no overlying erythema, induration, or other signs of local infection.  Skin prepped in a sterile fashion.  Local anesthesia: Topical Ethyl chloride.  With sterile technique and under real time ultrasound guidance: The patient was placed prone, using the ultrasound to visualize the Achilles insertion and guided a 25-gauge needle into the retrocalcaneal bursa, taking care to avoid intra-Achilles injection I placed 1/2 cc kenalog 40, 1/2 cc lidocaine easily.  The patient was then placed in a cam boot for the next week to protect the Achilles. Completed without difficulty  Pain immediately resolved suggesting accurate placement of the medication.  Advised to call if fevers/chills, erythema, induration, drainage, or persistent bleeding.  Images permanently stored and available for review in the ultrasound unit.  Impression: Technically successful ultrasound guided injection.  Impression and Recommendations:    Retrocalcaneal bursitis of both feet Left worse than right, injected today per patient request. Because of injection near the Achilles we are going to  use a cam boot for a week. I also planed down her orthotics a bit, they were made by Dr. Althea Charon at [facility]. I think they were a bit thick and uncomfortable for her arch. Return to see me in a week, we can inject the right retrocalcaneal bursa if needed. She tells me 75% of her pain is from the posterior heel and 25% from the plantar heel.  Vasomotor instability Currently taking gabapentin 300 mg daily, increase to twice a day. Unable to use hormone replacement due to history of PE. If insufficient relief with 300 mg of gabapentin twice a day we will add Celexa. ___________________________________________ Ihor Austin. Benjamin Stain, M.D., ABFM., CAQSM. Primary Care and Sports Medicine [facility]  Adjunct Instructor of Family Medicine  [employer]

## 2017-04-29 NOTE — Assessment & Plan Note (Signed)
Currently taking gabapentin 300 mg daily, increase to twice a day. Unable to use hormone replacement due to history of PE. If insufficient relief with 300 mg of gabapentin twice a day we will add Celexa.

## 2017-04-29 NOTE — Assessment & Plan Note (Signed)
Left worse than right, injected today per patient request. Because of injection near the Achilles we are going to use a cam boot for a week. I also planed down her orthotics a bit, they were made by Dr. Althea Charon at [facility] orthopedics. I think they were a bit thick and uncomfortable for her arch. Return to see me in a week, we can inject the right retrocalcaneal bursa if needed. She tells me 75% of her pain is from the posterior heel and 25% from the plantar heel.

## 2017-05-06 ENCOUNTER — Encounter: Payer: Self-pay | Admitting: Sports Medicine

## 2017-05-06 ENCOUNTER — Ambulatory Visit: Payer: Medicare Other | Admitting: Sports Medicine

## 2017-05-06 DIAGNOSIS — M7752 Other enthesopathy of left foot: Secondary | ICD-10-CM

## 2017-05-06 DIAGNOSIS — R55 Syncope and collapse: Secondary | ICD-10-CM

## 2017-05-06 DIAGNOSIS — M7751 Other enthesopathy of right foot: Secondary | ICD-10-CM

## 2017-05-06 MED ORDER — CITALOPRAM HYDROBROMIDE 10 MG PO TABS
10.0000 mg | ORAL_TABLET | Freq: Every day | ORAL | 11 refills | Status: DC
Start: 2017-05-06 — End: 2018-09-17

## 2017-05-06 NOTE — Assessment & Plan Note (Signed)
Did not notice much improvement going up to gabapentin 300 twice a day, she is going to drop back to once a day at bedtime but this time to 600 bedtime. Adding Celexa 10. Return in a month.

## 2017-05-06 NOTE — Progress Notes (Signed)
Subjective:    CC: Heel pain  HPI: This is a pleasant 67 year old female with retrocalcaneal bursitis bilaterally, I injected her left side last week and she did well, desires repeat injection on the right.  Pain is moderate, persistent, localized without radiation.  Postmenopausal vasomotor instability: Did not improve at all with increasing gabapentin to 600 mg, divided twice daily.  Agreeable to add an SSRI.  I reviewed the past medical history, family history, social history, surgical history, and allergies today and no changes were needed.  Please see the problem list section below in epic for further details.  Past Medical History: Past Medical History:  Diagnosis Date  . Cervical spondylosis     with disk protrusion  . Chest pain   . Diarrhea    The history was provided by the patient  . Fluttering heart   . Irregular heart rate   . Irritable bowel syndrome   . Osteomyelitis (HCC)    middle phalanx, right index finger  . Osteoporosis   . Palpitation   . Trigeminal neuralgia   . Vomiting    The history was provided by the patient   Past Surgical History: Past Surgical History:  Procedure Laterality Date  . CERVICAL DISC SURGERY    . COLONOSCOPY     with biopsies  . KNEE SURGERY    . TONSILLECTOMY    . TUBAL LIGATION    . UPPER GASTROINTESTINAL ENDOSCOPY      with biopsies   Social History: Social History   Socioeconomic History  . Marital status: Married    Spouse name: Not on file  . Number of children: Not on file  . Years of education: Not on file  . Highest education level: Not on file  Occupational History  . Not on file  Social Needs  . Financial resource strain: Not on file  . Food insecurity:    Worry: Not on file    Inability: Not on file  . Transportation needs:    Medical: Not on file    Non-medical: Not on file  Tobacco Use  . Smoking status: Never Smoker  . Smokeless tobacco: Never Used  Substance and Sexual Activity  . Alcohol  use: No  . Drug use: No  . Sexual activity: Never    Birth control/protection: Surgical  Lifestyle  . Physical activity:    Days per week: Not on file    Minutes per session: Not on file  . Stress: Not on file  Relationships  . Social connections:    Talks on phone: Not on file    Gets together: Not on file    Attends religious service: Not on file    Active member of club or organization: Not on file    Attends meetings of clubs or organizations: Not on file    Relationship status: Not on file  Other Topics Concern  . Not on file  Social History Narrative  . Not on file   Family History: Family History  Problem Relation Age of Onset  . Hypertension Mother   . Coronary artery disease Father    Allergies: Allergies  Allergen Reactions  . Morphine Anaphylaxis    Swelling   . Azithromycin Nausea And Vomiting  . Betadine [Povidone Iodine]   . Keflex [Cephalexin] Nausea And Vomiting  . Povidone-Iodine   . Promethazine Hcl   . Xarelto [Rivaroxaban] Other (See Comments)    Severe nausea  . Septra [Sulfamethoxazole-Trimethoprim] Rash   Medications: See med rec.  Review of Systems: No fevers, chills, night sweats, weight loss, chest pain, or shortness of breath.   Objective:    General: Well Developed, well nourished, and in no acute distress.  Neuro: Alert and oriented x3, extra-ocular muscles intact, sensation grossly intact.  HEENT: Normocephalic, atraumatic, pupils equal round reactive to light, neck supple, no masses, no lymphadenopathy, thyroid nonpalpable.  Skin: Warm and dry, no rashes. Cardiac: Regular rate and rhythm, no murmurs rubs or gallops, no lower extremity edema.  Respiratory: Clear to auscultation bilaterally. Not using accessory muscles, speaking in full sentences. Right foot: No visible erythema or swelling. Range of motion is full in all directions. Strength is 5/5 in all directions. No hallux valgus. No pes cavus or pes planus. No abnormal  callus noted. No pain over the navicular prominence, or base of fifth metatarsal. No tenderness to palpation of the calcaneal insertion of plantar fascia. No pain at the Achilles insertion. No pain over the calcaneal bursa. Tender at the retrocalcaneal bursa No tenderness to palpation over the tarsals, metatarsals, or phalanges. No hallux rigidus or limitus. No tenderness palpation over interphalangeal joints. No pain with compression of the metatarsal heads. Neurovascularly intact distally.  Procedure: Real-time Ultrasound Guided Injection of right retrocalcaneal bursa Device: GE Logiq E  Verbal informed consent obtained.  Time-out conducted.  Noted no overlying erythema, induration, or other signs of local infection.  Skin prepped in a sterile fashion.  Local anesthesia: Topical Ethyl chloride.  With sterile technique and under real time ultrasound guidance: 5 gauge needle advanced into the bursa, taking great care to avoid intra-Achilles injection I injected 1/2 cc Kenalog 40, 1/2 cc lidocaine. Completed without difficulty  Pain immediately resolved suggesting accurate placement of the medication.  Advised to call if fevers/chills, erythema, induration, drainage, or persistent bleeding.  Images permanently stored and available for review in the ultrasound unit.  Impression: Technically successful ultrasound guided injection.  Impression and Recommendations:    Retrocalcaneal bursitis of both feet Left retrocalcaneal bursa injected last week, she did extremely well and is requesting injection on the right side today. I planed down her orthotics at the last visit as well, they were made by an outside provider, they also feel more comfortable. Return as needed.  Vasomotor instability Did not notice much improvement going up to gabapentin 300 twice a day, she is going to drop back to once a day at bedtime but this time to 600 bedtime. Adding Celexa 10. Return in a  month. ___________________________________________ Ihor Austin. Benjamin Stain, M.D., ABFM., CAQSM. Primary Care and Sports Medicine [facility]  Adjunct Instructor of Family Medicine  [employer]

## 2017-05-06 NOTE — Assessment & Plan Note (Signed)
Left retrocalcaneal bursa injected last week, she did extremely well and is requesting injection on the right side today. I planed down her orthotics at the last visit as well, they were made by an outside provider, they also feel more comfortable. Return as needed.

## 2017-05-09 ENCOUNTER — Ambulatory Visit
Admission: RE | Admit: 2017-05-09 | Discharge: 2017-05-09 | Disposition: A | Discharge status: Home / Self Care | Payer: Medicare Other | Source: Ambulatory Visit | Attending: Obstetrics and Gynecology | Admitting: Obstetrics and Gynecology

## 2017-05-09 DIAGNOSIS — Z1231 Encounter for screening mammogram for malignant neoplasm of breast: Secondary | ICD-10-CM

## 2017-05-13 ENCOUNTER — Encounter: Payer: Self-pay | Admitting: Sports Medicine

## 2017-05-13 DIAGNOSIS — R059 Cough, unspecified: Secondary | ICD-10-CM

## 2017-05-13 DIAGNOSIS — R05 Cough: Secondary | ICD-10-CM

## 2017-05-14 MED ORDER — HYOSCYAMINE SULFATE 0.125 MG PO TABS: 0.1250 mg | ORAL_TABLET | ORAL | 3 refills | Status: DC | PRN

## 2017-05-30 ENCOUNTER — Encounter: Payer: Self-pay | Admitting: Sports Medicine

## 2017-05-30 DIAGNOSIS — R002 Palpitations: Secondary | ICD-10-CM

## 2017-05-30 MED ORDER — NEBIVOLOL HCL 5 MG PO TABS: 5.0000 mg | ORAL_TABLET | Freq: Every day | ORAL | 11 refills | Status: DC

## 2017-06-01 ENCOUNTER — Other Ambulatory Visit: Payer: Self-pay | Admitting: Sports Medicine

## 2017-06-07 ENCOUNTER — Encounter: Payer: Self-pay | Admitting: Sports Medicine

## 2017-06-11 ENCOUNTER — Ambulatory Visit: Payer: Medicare Other | Admitting: Sports Medicine

## 2017-08-30 ENCOUNTER — Other Ambulatory Visit: Payer: Self-pay | Admitting: Sports Medicine

## 2017-08-30 NOTE — Telephone Encounter (Signed)
Gabapentin and Lorazepam RF requests pended.  Pt cancelled last OV and has not rescheduled.   Please review RXs and send if appropriate  Thanks!

## 2017-09-16 ENCOUNTER — Other Ambulatory Visit: Payer: Self-pay | Admitting: Sports Medicine

## 2017-10-08 ENCOUNTER — Other Ambulatory Visit: Payer: Self-pay | Admitting: Sports Medicine

## 2017-10-18 ENCOUNTER — Other Ambulatory Visit: Payer: Self-pay | Admitting: Orthopedic Surgery

## 2017-10-18 DIAGNOSIS — M4722 Other spondylosis with radiculopathy, cervical region: Secondary | ICD-10-CM

## 2017-10-18 DIAGNOSIS — M542 Cervicalgia: Secondary | ICD-10-CM

## 2017-10-31 ENCOUNTER — Ambulatory Visit
Admission: RE | Admit: 2017-10-31 | Discharge: 2017-10-31 | Disposition: A | Discharge status: Home / Self Care | Payer: Medicare Other | Source: Ambulatory Visit | Attending: Orthopedic Surgery | Admitting: Orthopedic Surgery

## 2017-10-31 DIAGNOSIS — M542 Cervicalgia: Secondary | ICD-10-CM

## 2017-10-31 DIAGNOSIS — M4722 Other spondylosis with radiculopathy, cervical region: Secondary | ICD-10-CM

## 2017-11-25 ENCOUNTER — Other Ambulatory Visit: Payer: Self-pay | Admitting: Sports Medicine

## 2018-01-13 ENCOUNTER — Inpatient Hospital Stay: Payer: Medicare Other | Attending: Genetic Counselor | Admitting: Licensed Clinical Social Worker

## 2018-01-13 ENCOUNTER — Encounter: Payer: Self-pay | Admitting: Licensed Clinical Social Worker

## 2018-01-13 ENCOUNTER — Inpatient Hospital Stay: Payer: Medicare Other

## 2018-01-13 DIAGNOSIS — Z8042 Family history of malignant neoplasm of prostate: Secondary | ICD-10-CM | POA: Diagnosis not present

## 2018-01-13 DIAGNOSIS — Z8481 Family history of carrier of genetic disease: Secondary | ICD-10-CM | POA: Insufficient documentation

## 2018-01-13 DIAGNOSIS — Z1379 Encounter for other screening for genetic and chromosomal anomalies: Secondary | ICD-10-CM

## 2018-01-13 DIAGNOSIS — Z800 Family history of malignant neoplasm of digestive organs: Secondary | ICD-10-CM | POA: Diagnosis not present

## 2018-01-13 DIAGNOSIS — Z806 Family history of leukemia: Secondary | ICD-10-CM | POA: Insufficient documentation

## 2018-01-13 DIAGNOSIS — Z803 Family history of malignant neoplasm of breast: Secondary | ICD-10-CM

## 2018-01-13 NOTE — Progress Notes (Signed)
REFERRING PROVIDER: Self-referred  PRIMARY PROVIDER:  Silverio Decamp, MD  PRIMARY REASON FOR VISIT:  1. Family history of breast cancer   2. Family history of prostate cancer   3. Family history of pancreatic cancer   4. Family history of leukemia   5. Family history of breast cancer gene mutation in first degree relative      HISTORY OF PRESENT ILLNESS:   Ms. Ingman, a 68 y.o. female, was seen for a [facility] cancer genetics consultation due to her sister's recent genetic testing which revealed an ATM mutation.  Ms. Tarleton presents to clinic today to discuss the possibility of a hereditary predisposition to cancer, genetic testing, and to further clarify her future cancer risks, as well as potential cancer risks for family members.   Ms. Looman is a 68 y.o. female with no personal history of cancer.    HORMONAL RISK FACTORS:  Menarche was at age 41.  First live birth at age 56.  OCP use for approximately 30 years.  Ovaries intact: yes.  Hysterectomy: no.  Menopausal status: postmenopausal.  HRT use: 10 years. Colonoscopy: yes; normal. Mammogram within the last year: yes. Number of breast biopsies: 0.  Past Medical History:  Diagnosis Date  . Cervical spondylosis     with disk protrusion  . Chest pain   . Diarrhea    The history was provided by the patient  . Family history of breast cancer   . Family history of breast cancer gene mutation in first degree relative   . Family history of leukemia   . Family history of pancreatic cancer   . Family history of prostate cancer   . Fluttering heart   . Irregular heart rate   . Irritable bowel syndrome   . 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 Osteoporosis   . Palpitation   . Trigeminal neuralgia   . Vomiting    The history was provided by the patient    Past Surgical History:  Procedure Laterality Date  . CERVICAL DISC SURGERY    . COLONOSCOPY     with biopsies  . KNEE SURGERY    .  TONSILLECTOMY    . TUBAL LIGATION    . UPPER GASTROINTESTINAL ENDOSCOPY      with biopsies    Social History   Socioeconomic History  . Marital status: Married    Spouse name: Not on file  . Number of children: Not on file  . Years of education: Not on file  . Highest education level: Not on file  Occupational History  . Not on file  Social Needs  . Financial resource strain: Not on file  . Food insecurity:    Worry: Not on file    Inability: Not on file  . Transportation needs:    Medical: Not on file    Non-medical: Not on file  Tobacco Use  . Smoking status: Never Smoker  . Smokeless tobacco: Never Used  Substance and Sexual Activity  . Alcohol use: No  . Drug use: No  . Sexual activity: Never    Birth control/protection: Surgical  Lifestyle  . Physical activity:    Days per week: Not on file    Minutes per session: Not on file  . Stress: Not on file  Relationships  . Social connections:    Talks on phone: Not on file    Gets together: Not on file    Attends religious service: Not on  file    Active member of club or organization: Not on file    Attends meetings of clubs or organizations: Not on file    Relationship status: Not on file  Other Topics Concern  . Not on file  Social History Narrative  . Not on file     FAMILY HISTORY:  We obtained a detailed, 4-generation family history.  Significant diagnoses are listed below: Family History  Problem Relation Age of Onset  . Hypertension Mother   . Coronary artery disease Father   . Breast cancer Sister 40  . Prostate cancer Brother 90  . Pancreatic cancer Maternal Aunt 61  . Breast cancer Cousin        dx 6s    Ms. Mazon has one son, 63 and a daughter, 34. She has four grandchildren. She has a sister, Opal Sidles, who recently tested positive for an ATM pathogenic variant. This sister had breast cancer diagnosed at 64 and is living at 4. The patient also has a brother, Timmothy Sours, who had prostate cancer diagnosed  at 42 and is living at 29.  Ms. Zucker father died at 11 of leukemia. He had a brother and four sisters, no cancer history. The patient does have a paternal cousin who had breast cancer diagnosed in her mid 79s. There is no information about the patient's paternal grandfather, her paternal grandmother died in her early 49s.  Ms. Collinsworth mother died at 11 of dementia. She had 3 sisters and 3 brothers. One of her sisters had pancreatic cancer at 33 and died at 51. No cancers in maternal cousins, no info about maternal grandparents.   Ms. Gimbel is aware of previous family history of genetic testing for hereditary cancer risks. Patient's maternal ancestors are of Caucasian descent, and paternal ancestors are of Caucasian descent. There is no reported Ashkenazi Jewish ancestry. There is no known consanguinity.  GENETIC COUNSELING ASSESSMENT: Akasia Ahmad Lisbon is a 68 y.o. female with a family history of an ATM pathogenic variant. We, therefore, discussed and recommended the following at today's visit.   DISCUSSION:  We discussed the cancer risks and NCCN guidelines management for individuals with an ATM pathogenic variant, including the addition of an annual breast MRI and potential for pancreatic cancer screening. We reviewed the characteristics, features and inheritance patterns of hereditary cancer syndromes. We also discussed genetic testing, including the appropriate family members to test, the process of testing, insurance coverage and turn-around-time for results. We discussed the implications of a negative, positive and/or variant of uncertain significant result. We recommended Ms. Stangler pursue genetic testing for at least the ATM pathogenic variant, and also offered the [facility] Common Hereditary Cancers Panel.  The Common Hereditary Cancers Panel offered by [facility] includes sequencing and/or deletion duplication testing of the following 47 genes: APC, ATM, AXIN2, BARD1, BMPR1A, BRCA1, BRCA2, BRIP1, CDH1,  CDKN2A (p14ARF), CDKN2A (p16INK4a), CKD4, CHEK2, CTNNA1, DICER1, EPCAM (Deletion/duplication testing only), GREM1 (promoter region deletion/duplication testing only), KIT, MEN1, MLH1, MSH2, MSH3, MSH6, MUTYH, NBN, NF1, NHTL1, PALB2, PDGFRA, PMS2, POLD1, POLE, PTEN, RAD50, RAD51C, RAD51D, SDHB, SDHC, SDHD, SMAD4, SMARCA4. STK11, TP53, TSC1, TSC2, and VHL.  The following genes were evaluated for sequence changes only: SDHA and HOXB13 c.251G>A variant only.  We discussed that if she is found to have a mutation in one of these genes, it may impact future medical management recommendations such as increased cancer screenings and consideration of risk reducing surgeries.  A positive result could also have implications for the patient's family members.  A Negative result would mean we were unable to identify the ATM pathogenic variant and did not identify any other variants There could be mutations that are undetectable by current technology, or in genes not yet tested or identified to increase cancer risk.    We discussed the potential to find a Variant of Uncertain Significance or VUS.  These are variants that have not yet been identified as pathogenic or benign, and it is unknown if this variant is associated with increased cancer risk or if this is a normal finding.  Most VUS's are reclassified to benign or likely benign.   It should not be used to make medical management decisions. With time, we suspect the lab will determine the significance of any VUS's identified if any.   Based on Ms. Koone's family history of ATM+ and cancer, she meets NCCN medical criteria for genetic testing. Despite that she meets criteria, she may still have an out of pocket cost. The lab will notify her of an OOP if any.  PLAN: After considering the risks, benefits, and limitations, Ms. Bellows  provided informed consent to pursue genetic testing and the blood sample was sent to [facility] for analysis of the Common  Hereditary Cancers Panel. Results should be available within approximately 2-3 weeks' time, at which point they will be disclosed by telephone to Ms. Kuch, as will any additional recommendations warranted by these results. Ms. Masser will receive a summary of her genetic counseling visit and a copy of her results once available. This information will also be available in Epic.  Based on Ms. Grossi's family history, we recommended her brother and maternal relatives have genetic counseling and testing. Ms. Smotherman will let us know if we can be of any assistance in coordinating genetic counseling and/or testing for this family member.   Lastly, we encouraged Ms. Quant to remain in contact with cancer genetics annually so that we can continuously update the family history and inform her of any changes in cancer genetics and testing that may be of benefit for this family.   Ms.  Czerwinski questions were answered to her satisfaction today. Our contact information was provided should additional questions or concerns arise. Thank you for the referral and allowing [place] to share in the care of your patient.   Faith Rogue, MS Genetic Counselor [email or web address] Phone: [phone]  The patient was seen for a total of 35 minutes in face-to-face genetic counseling.  The patient was accompanied today by her husband, Clair Gulling.

## 2018-01-22 ENCOUNTER — Telehealth: Payer: Self-pay | Admitting: Licensed Clinical Social Worker

## 2018-01-22 ENCOUNTER — Encounter: Payer: Self-pay | Admitting: Licensed Clinical Social Worker

## 2018-01-22 ENCOUNTER — Ambulatory Visit: Payer: Self-pay | Admitting: Licensed Clinical Social Worker

## 2018-01-22 DIAGNOSIS — Z1379 Encounter for other screening for genetic and chromosomal anomalies: Secondary | ICD-10-CM | POA: Insufficient documentation

## 2018-01-22 NOTE — Telephone Encounter (Signed)
Revealed negative genetic testing.  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 This normal result is very reassuring--she does not have the familial ATM mutation and is therefore at general population risk for ATM-related cancers.  It is unlikely that there is an increased risk of another cancer due to a mutation in one of these genes.  However, genetic testing is not perfect, and cannot definitively rule out a hereditary cause.  It will be important for her to keep in contact with genetics to learn if any additional testing may be needed in the future. We discussed she should continue to follow doctors recommendations for cancer screenings and to let her family members know about the familial ATM mutation.

## 2018-01-22 NOTE — Progress Notes (Signed)
HPI:  Ms. Baltz was previously seen in the [place] clinic on 01/13/2018 due to her sister's recent test results which revealed an ATM mutation, and concerns regarding a hereditary predisposition to cancer. Please refer to our prior cancer genetics clinic note for more information regarding Ms. Tullo's medical, social and family histories, and our assessment and recommendations, at the time. Ms. Muilenburg recent genetic test results were disclosed to her, as well as recommendations warranted by these results. These results and recommendations are discussed in more detail below.  CANCER HISTORY:   No history exists.     FAMILY HISTORY:  We obtained a detailed, 4-generation family history.  Significant diagnoses are listed below: Family History  Problem Relation Age of Onset  . Hypertension Mother   . Coronary artery disease Father   . Breast cancer Sister 43  . Prostate cancer Brother 40  . Pancreatic cancer Maternal Aunt 61  . Breast cancer Cousin        dx 2s    Ms. Feldmeier has one son, 68 and a daughter, 68. She has four grandchildren. She has a sister, Opal Sidles, who recently tested positive for an ATM pathogenic variant. This sister had breast cancer diagnosed at 42 and is living at 93. The patient also has a brother, Timmothy Sours, who had prostate cancer diagnosed at 110 and is living at 50.  Ms. Touchette father died at 14 of leukemia. He had a brother and four sisters, no cancer history. The patient does have a paternal cousin who had breast cancer diagnosed in her mid 71s. There is no information about the patient's paternal grandfather, her paternal grandmother died in her early 39s.  Ms. Fuhrer mother died at 68 of dementia. She had 3 sisters and 3 brothers. One of her sisters had pancreatic cancer at 31 and died at 81. No cancers in maternal cousins, no info about maternal grandparents.   Ms. Hemsley is aware of previous family history 68 of genetic testing for hereditary cancer risks.  Patient's maternal ancestors are of Caucasian descent, and paternal ancestors are of Caucasian descent. There is no reported Ashkenazi Jewish ancestry. There is no known consanguinity.  GENETIC TEST RESULTS: Genetic testing performed through [facility]'s Common Hereditary Cancer Panel reported out on 01/21/2018 showed no pathogenic mutations. The Common Hereditary Cancers Panel offered by [facility] includes sequencing and/or deletion duplication testing of the following 47 genes: APC, ATM, AXIN2, BARD1, BMPR1A, BRCA1, BRCA2, BRIP1, CDH1, CDKN2A (p14ARF), CDKN2A (p16INK4a), CKD4, CHEK2, CTNNA1, DICER1, EPCAM (Deletion/duplication testing only), GREM1 (promoter region deletion/duplication testing only), KIT, MEN1, MLH1, MSH2, MSH3, MSH6, MUTYH, NBN, NF1, NHTL1, PALB2, PDGFRA, PMS2, POLD1, POLE, PTEN, RAD50, RAD51C, RAD51D, SDHB, SDHC, SDHD, SMAD4, SMARCA4. STK11, TP53, TSC1, TSC2, and VHL.  The following genes were evaluated for sequence changes only: SDHA and HOXB13 c.251G>A variant only.  A VUS in a gene called APC was also identified.  The test report will be scanned into EPIC and will be located under the Molecular Pathology section of the Results Review tab. A portion of the result report is included below for reference.     We recommended Ms. Ho pursue testing for the familial hereditary cancer gene mutation called ATM [id number] (p.Cys430*). Ms. Tortorelli test was normal and did not reveal the familial mutation. We call this result a true negative result because the cancer-causing mutation was identified in Ms. Dildine's family, and she did not inherit it.  Given this negative result, Ms. Shoaf chances of developing ATM-related cancers are the  same as they are in the general population.    Regarding the VUS in APC: At this time, it is unknown if this variant is associated with increased cancer risk or if this is a normal finding, but most variants such as this get reclassified to being inconsequential.  It should not be used to make medical management decisions. With time, we suspect the lab will determine the significance of this variant, if any. If we do learn more about it, we will try to contact Ms. Hoelzer to discuss it further. However, it is important to stay in touch with [place] periodically and keep the address and phone number up to date.  ADDITIONAL GENETIC TESTING:We discussed with Ms. Weisel that her genetic testing was fairly extensive.  If there are are genes identified to increase cancer risk that can be analyzed in the future, we would be happy to discuss and coordinate this testing at that time.    CANCER SCREENING RECOMMENDATIONS: Ms. Ahn test result is considered negative (normal).  This means that we have not identified the known familial hereditary cause for her family history of cancer in her, and she is at general population risk for ATM-related cancers.  However, this does not mean that Ms. Bacote will never develop cancer. Additionally, it is still possible that there could be genetic mutations that are undetectable by current technology, or genetic mutations in genes that have not been tested or identified to increase cancer risk. Therefore, It is recommended she continue to follow the cancer management and screening guidelines provided by her oncology and primary healthcare provider. An individual's cancer risk is not determined by genetic test results alone.  Overall cancer risk assessment includes additional factors such as personal medical history, family history, etc.  These should be used to make a personalized plan for cancer prevention and surveillance.    Based on the Ms. Grisanti's family history of cancer, as well as her genetic test results, the risk model Harriett Rush was used to estimate her lifetime risk of breast cancer. These estimate her lifetime risk of developing breast cancer to be approximately 4.6%. The patient's lifetime breast cancer risk is a preliminary estimate  based on available information using one of several models endorsed by the [place] (ACS). The ACS recommends consideration of breast MRI screening as an adjunct to mammography for patients at high risk (defined as 20% or greater lifetime risk). A more detailed breast cancer risk assessment can be considered, if clinically indicated.      RECOMMENDATIONS FOR FAMILY MEMBERS:  Relatives in this family might be at some increased risk of developing cancer, over the general population risk, simply due to the family history of cancer.  We recommended women in this family have a yearly mammogram beginning at age 33, or 13 years younger than the earliest onset of cancer, an annual clinical breast exam, and perform monthly breast self-exams. Women in this family should also have a gynecological exam as recommended by their primary provider. All family members should have a colonoscopy as directed by their doctors.  All family members should inform their physicians about the family history of cancer so their doctors can make the most appropriate screening recommendations for them.   We recommended her brother and individuals on both sides of her family have genetic counseling and testing. Ms. Stupka will let us know if we can be of any assistance in coordinating genetic counseling and/or testing for these family members.   FOLLOW-UP: Lastly, we  discussed with Ms. Gauthreaux that cancer genetics is a rapidly advancing field and it is possible that new genetic tests will be appropriate for her and/or her family members in the future. We encouraged her to remain in contact with cancer genetics on an annual basis so we can update her personal and family histories and let her know of advances in cancer genetics that may benefit this family.   Our contact number was provided. Ms. Chakraborty questions were answered to her satisfaction, and she knows she is welcome to call us at anytime with additional questions or  concerns.  Faith Rogue, MS Genetic Counselor [email or web address] Phone: [phone]

## 2018-01-23 ENCOUNTER — Encounter: Payer: Self-pay | Admitting: Sports Medicine

## 2018-01-24 NOTE — Telephone Encounter (Signed)
Received fax that Dexilant has been approved for a lower co pay and the Bystolic has been approve for a tier 2 co pay. Patient and pharmacy aware.

## 2018-02-09 ENCOUNTER — Encounter: Payer: Self-pay | Admitting: Sports Medicine

## 2018-02-10 MED ORDER — FLUCONAZOLE 150 MG PO TABS: 150.0000 mg | ORAL_TABLET | Freq: Once | ORAL | 0 refills | Status: AC

## 2018-02-25 ENCOUNTER — Other Ambulatory Visit: Payer: Self-pay | Admitting: Sports Medicine

## 2018-03-04 ENCOUNTER — Other Ambulatory Visit: Payer: Self-pay | Admitting: Sports Medicine

## 2018-05-21 ENCOUNTER — Other Ambulatory Visit: Payer: Self-pay | Admitting: Sports Medicine

## 2018-05-21 ENCOUNTER — Encounter: Payer: Self-pay | Admitting: Sports Medicine

## 2018-05-21 ENCOUNTER — Telehealth (INDEPENDENT_AMBULATORY_CARE_PROVIDER_SITE_OTHER): Payer: Medicare Other | Admitting: Sports Medicine

## 2018-05-21 DIAGNOSIS — R002 Palpitations: Secondary | ICD-10-CM

## 2018-05-21 DIAGNOSIS — K580 Irritable bowel syndrome with diarrhea: Secondary | ICD-10-CM

## 2018-05-21 DIAGNOSIS — Z0000 Encounter for general adult medical examination without abnormal findings: Secondary | ICD-10-CM

## 2018-05-21 DIAGNOSIS — R55 Syncope and collapse: Secondary | ICD-10-CM | POA: Diagnosis not present

## 2018-05-21 MED ORDER — GABAPENTIN 300 MG PO CAPS: 300.0000 mg | ORAL_CAPSULE | Freq: Every day | ORAL | 1 refills | Status: DC

## 2018-05-21 MED ORDER — NEBIVOLOL HCL 5 MG PO TABS: 5.0000 mg | ORAL_TABLET | Freq: Every day | ORAL | 1 refills | Status: DC

## 2018-05-21 MED ORDER — DICYCLOMINE HCL 20 MG PO TABS: 20.0000 mg | ORAL_TABLET | Freq: Three times a day (TID) | ORAL | 3 refills | Status: DC | PRN

## 2018-05-21 NOTE — Telephone Encounter (Signed)
She is going to need an appointment, I have not seen her in over a year

## 2018-05-21 NOTE — Progress Notes (Signed)
Virtual Visit via WebEx/MyChart   I connected with  Kathleen Garcia  on 05/21/18 via WebEx/MyChart/Doximity Video and verified that I am speaking with the correct person using two identifiers.   I discussed the limitations, risks, security and privacy concerns of performing an evaluation and management service by WebEx/MyChart/Doximity Video, including the higher likelihood of inaccurate diagnosis and treatment, and the availability of in person appointments.  We also discussed the likely need of an additional face to face encounter for complete and high quality delivery of care.  I also discussed with the patient that there may be a patient responsible charge related to this service. The patient expressed understanding and wishes to proceed.  Provider location is either at home or medical facility. Patient location is at their home, different from provider location. People involved in care of the patient during this telehealth encounter were myself, my nurse/medical assistant, and my front office/scheduling team member.  Subjective:    CC: Follow-up  HPI: Hot flashes: Well-controlled with gabapentin, needs a refill.  Palpitations: Improved with Bystolic, needs a refill.  IBS-D: Occasionally needs to use Ativan before provocative situations, this seems to work well but high ostomy and has not provided any relief.  I reviewed the past medical history, family history, social history, surgical history, and allergies today and no changes were needed.  Please see the problem list section below in epic for further details.  Past Medical History: Past Medical History:  Diagnosis Date  . Cervical spondylosis     with disk protrusion  . Chest pain   . Diarrhea    The history was provided by the patient  . Family history of breast cancer   . Family history of breast cancer gene mutation in first degree relative   . Family history of leukemia   . Family history of pancreatic cancer   .  Family history of prostate cancer   . Fluttering heart   . Irregular heart rate   . Irritable bowel syndrome   . Osteomyelitis (HCC)    middle phalanx, right index finger  . Osteoporosis   . Palpitation   . Trigeminal neuralgia   . Vomiting    The history was provided by the patient   Past Surgical History: Past Surgical History:  Procedure Laterality Date  . CERVICAL DISC SURGERY    . COLONOSCOPY     with biopsies  . KNEE SURGERY    . TONSILLECTOMY    . TUBAL LIGATION    . UPPER GASTROINTESTINAL ENDOSCOPY      with biopsies   Social History: Social History   Socioeconomic History  . Marital status: Married    Spouse name: Not on file  . Number of children: Not on file  . Years of education: Not on file  . Highest education level: Not on file  Occupational History  . Not on file  Social Needs  . Financial resource strain: Not on file  . Food insecurity:    Worry: Not on file    Inability: Not on file  . Transportation needs:    Medical: Not on file    Non-medical: Not on file  Tobacco Use  . Smoking status: Never Smoker  . Smokeless tobacco: Never Used  Substance and Sexual Activity  . Alcohol use: No  . Drug use: No  . Sexual activity: Never    Birth control/protection: Surgical  Lifestyle  . Physical activity:    Days per week: Not on file  Minutes per session: Not on file  . Stress: Not on file  Relationships  . Social connections:    Talks on phone: Not on file    Gets together: Not on file    Attends religious service: Not on file    Active member of club or organization: Not on file    Attends meetings of clubs or organizations: Not on file    Relationship status: Not on file  Other Topics Concern  . Not on file  Social History Narrative  . Not on file   Family History: Family History  Problem Relation Age of Onset  . Hypertension Mother   . Coronary artery disease Father   . Breast cancer Sister 356  . Prostate cancer Brother [id number]  .  Pancreatic cancer Maternal Aunt 53  . Breast cancer Cousin        dx 60s   Allergies: Allergies  Allergen Reactions  . Morphine Anaphylaxis    Swelling   . Azithromycin Nausea And Vomiting  . Betadine [Povidone Iodine]   . Keflex [Cephalexin] Nausea And Vomiting  . Povidone-Iodine   . Promethazine Hcl   . Xarelto [Rivaroxaban] Other (See Comments)    Severe nausea  . Septra [Sulfamethoxazole-Trimethoprim] Rash   Medications: See med rec.  Review of Systems: No fevers, chills, night sweats, weight loss, chest pain, or shortness of breath.   Objective:    General: Speaking full sentences, no audible heavy breathing.  Sounds alert and appropriately interactive.  Appears well.  Face symmetric.  Extraocular movements intact.  Pupils equal and round.  No nasal flaring or accessory muscle use visualized.  No other physical exam performed due to the non-physical nature of this visit.  Impression and Recommendations:    Annual physical exam We have not done a routine physical since 2018, she will return in September for a fasting physical, and immunizations.  Palpitations Refilling Bystolic  Vasomotor instability Well-controlled with gabapentin 300 at bedtime, 10 of Celexa. Avoiding hormonal therapy due to PE.  Irritable bowel syndrome with diarrhea Ativan before anxiety provoking activities has been helpful. Levsin not as helpful, switching to Bentyl.  I discussed the above assessment and treatment plan with the patient. The patient was provided an opportunity to ask questions and all were answered. The patient agreed with the plan and demonstrated an understanding of the instructions.   The patient was advised to call back or seek an in-person evaluation if the symptoms worsen or if the condition fails to improve as anticipated.   I provided 25 minutes of non-face-to-face time during this encounter, 15 minutes of additional time was needed to gather information, review chart,  records, communicate/coordinate with staff remotely, troubleshooting the multiple errors that we get every time when trying to do video calls through the electronic medical record, WebEx, and Doximity, restart the encounter multiple times due to instability of the software, as well as complete documentation.   ___________________________________________ Ihor Austinhomas J. Benjamin Stainhekkekandam, M.D., ABFM., CAQSM. Primary Care and Sports Medicine [facility]  Adjunct Professor of Family Medicine  [employer]

## 2018-05-21 NOTE — Assessment & Plan Note (Signed)
Ativan before anxiety provoking activities has been helpful. Levsin not as helpful, switching to Bentyl.

## 2018-05-21 NOTE — Assessment & Plan Note (Signed)
Well-controlled with gabapentin 300 at bedtime, 10 of Celexa. Avoiding hormonal therapy due to PE.

## 2018-05-21 NOTE — Assessment & Plan Note (Signed)
Refilling Bystolic

## 2018-05-21 NOTE — Telephone Encounter (Signed)
Please contact to schedule

## 2018-05-21 NOTE — Assessment & Plan Note (Signed)
We have not done a routine physical since 2018, she will return in September for a fasting physical, and immunizations.

## 2018-05-27 ENCOUNTER — Other Ambulatory Visit: Payer: Self-pay | Admitting: Obstetrics and Gynecology

## 2018-05-27 DIAGNOSIS — Z9289 Personal history of other medical treatment: Secondary | ICD-10-CM

## 2018-05-29 ENCOUNTER — Ambulatory Visit
Admission: RE | Admit: 2018-05-29 | Discharge: 2018-05-29 | Disposition: A | Discharge status: Home / Self Care | Payer: Medicare Other | Source: Ambulatory Visit | Attending: Obstetrics and Gynecology | Admitting: Obstetrics and Gynecology

## 2018-05-29 ENCOUNTER — Other Ambulatory Visit: Payer: Self-pay

## 2018-05-29 DIAGNOSIS — Z9289 Personal history of other medical treatment: Secondary | ICD-10-CM

## 2018-08-22 ENCOUNTER — Other Ambulatory Visit: Payer: Self-pay | Admitting: Sports Medicine

## 2018-09-17 ENCOUNTER — Ambulatory Visit (INDEPENDENT_AMBULATORY_CARE_PROVIDER_SITE_OTHER): Payer: Medicare Other | Admitting: Sports Medicine

## 2018-09-17 VITALS — BP 104/68 | HR 69 | Ht 61.0 in | Wt 139.0 lb

## 2018-09-17 DIAGNOSIS — K580 Irritable bowel syndrome with diarrhea: Secondary | ICD-10-CM | POA: Diagnosis not present

## 2018-09-17 DIAGNOSIS — G4762 Sleep related leg cramps: Secondary | ICD-10-CM

## 2018-09-17 DIAGNOSIS — Z0000 Encounter for general adult medical examination without abnormal findings: Secondary | ICD-10-CM | POA: Diagnosis not present

## 2018-09-17 DIAGNOSIS — Z23 Encounter for immunization: Secondary | ICD-10-CM | POA: Diagnosis not present

## 2018-09-17 MED ORDER — MAGNESIUM OXIDE -MG SUPPLEMENT 200 MG PO TABS: 2.0000 | ORAL_TABLET | Freq: Every day | ORAL | 11 refills | Status: DC

## 2018-09-17 MED ORDER — SHINGRIX 50 MCG/0.5ML IM SUSR: 0.5000 mL | Freq: Once | INTRAMUSCULAR | 0 refills | Status: AC

## 2018-09-17 NOTE — Progress Notes (Signed)
Subjective:    CC: Annual physical exam  HPI:  Kathleen Garcia is here for her physical, see below for further details.  I reviewed the past medical history, family history, social history, surgical history, and allergies today and no changes were needed.  Please see the problem list section below in epic for further details.  Past Medical History: Past Medical History:  Diagnosis Date  . Cervical spondylosis     with disk protrusion  . Chest pain   . Diarrhea    The history was provided by the patient  . Family history of breast cancer   . Family history of breast cancer gene mutation in first degree relative   . Family history of leukemia   . Family history of pancreatic cancer   . Family history of prostate cancer   . Fluttering heart   . Irregular heart rate   . Irritable bowel syndrome   . Osteomyelitis ([place])    middle phalanx, right index finger  . Osteoporosis   . Palpitation   . Trigeminal neuralgia   . Vomiting    The history was provided by the patient   Past Surgical History: Past Surgical History:  Procedure Laterality Date  . CERVICAL DISC SURGERY    . COLONOSCOPY     with biopsies  . KNEE SURGERY    . TONSILLECTOMY    . TUBAL LIGATION    . UPPER GASTROINTESTINAL ENDOSCOPY      with biopsies   Social History: Social History   Socioeconomic History  . Marital status: Married    Spouse name: Not on file  . Number of children: Not on file  . Years of education: Not on file  . Highest education level: Not on file  Occupational History  . Not on file  Social Needs  . Financial resource strain: Not on file  . Food insecurity    Worry: Not on file    Inability: Not on file  . Transportation needs    Medical: Not on file    Non-medical: Not on file  Tobacco Use  . Smoking status: Never Smoker  . Smokeless tobacco: Never Used  Substance and Sexual Activity  . Alcohol use: No  . Drug use: No  . Sexual activity: Never    Birth control/protection:  Surgical  Lifestyle  . Physical activity    Days per week: Not on file    Minutes per session: Not on file  . Stress: Not on file  Relationships  . Social Herbalist on phone: Not on file    Gets together: Not on file    Attends religious service: Not on file    Active member of club or organization: Not on file    Attends meetings of clubs or organizations: Not on file    Relationship status: Not on file  Other Topics Concern  . Not on file  Social History Narrative  . Not on file   Family History: Family History  Problem Relation Age of Onset  . Hypertension Mother   . Coronary artery disease Father   . Breast cancer Sister 15  . Prostate cancer Brother 48  . Pancreatic cancer Maternal Aunt 8  . Breast cancer Cousin        dx 60s   Allergies: Allergies  Allergen Reactions  . Morphine Anaphylaxis    Swelling   . Azithromycin Nausea And Vomiting  . Betadine [Povidone Iodine]   . Keflex [Cephalexin] Nausea And Vomiting  .  Povidone-Iodine   . Promethazine Hcl   . Xarelto [Rivaroxaban] Other (See Comments)    Severe nausea  . Septra [Sulfamethoxazole-Trimethoprim] Rash   Medications: See med rec.  Review of Systems: No headache, visual changes, nausea, vomiting, diarrhea, constipation, dizziness, abdominal pain, skin rash, fevers, chills, night sweats, swollen lymph nodes, weight loss, chest pain, body aches, joint swelling, muscle aches, shortness of breath, mood changes, visual or auditory hallucinations.  Objective:    General: Well Developed, well nourished, and in no acute distress.  Neuro: Alert and oriented x3, extra-ocular muscles intact, sensation grossly intact. Cranial nerves II through XII are intact, motor, sensory, and coordinative functions are all intact. HEENT: Normocephalic, atraumatic, pupils equal round reactive to light, neck supple, no masses, no lymphadenopathy, thyroid nonpalpable. Oropharynx, nasopharynx, external ear canals are  unremarkable. Skin: Warm and dry, no rashes noted.  Cardiac: Regular rate and rhythm, no murmurs rubs or gallops.  Respiratory: Clear to auscultation bilaterally. Not using accessory muscles, speaking in full sentences.  Abdominal: Soft, nontender, nondistended, positive bowel sounds, no masses, no organomegaly.  Musculoskeletal: Shoulder, elbow, wrist, hip, knee, ankle stable, and with full range of motion.  Impression and Recommendations:    The patient was counselled, risk factors were discussed, anticipatory guidance given.  Annual physical exam Annual physical as above, routine labs ordered. Due for pneumococcal 23, flu shot, hep C, bone density Shingrix.  Nocturnal leg cramps Low-dose gabapentin, oral magnesium oxide. The magnesium oxide pills are a bit large so we are going to try to find a more palatable form for her.  Irritable bowel syndrome with diarrhea Currently well controlled with a bit of Bentyl as needed as well as doxepin prescribed by her gynecologist.   ___________________________________________ Ihor Austinhomas J. Benjamin Stainhekkekandam, M.D., ABFM., CAQSM. Primary Care and Sports Medicine [facility]  Adjunct Professor of Family Medicine  [employer]

## 2018-09-17 NOTE — Assessment & Plan Note (Signed)
Annual physical as above, routine labs ordered. Due for pneumococcal 23, flu shot, hep C, bone density Shingrix.

## 2018-09-17 NOTE — Assessment & Plan Note (Signed)
Low-dose gabapentin, oral magnesium oxide. The magnesium oxide pills are a bit large so we are going to try to find a more palatable form for her.

## 2018-09-17 NOTE — Assessment & Plan Note (Signed)
Currently well controlled with a bit of Bentyl as needed as well as doxepin prescribed by her gynecologist.

## 2018-09-18 LAB — COMPLETE METABOLIC PANEL WITH GFR
AG Ratio: 1.4 (calc) (ref 1.0–2.5)
ALT: 15 U/L (ref 6–29)
AST: 24 U/L (ref 10–35)
Albumin: 4.3 g/dL (ref 3.6–5.1)
Alkaline phosphatase (APISO): 81 U/L (ref 37–153)
BUN: 12 mg/dL (ref 7–25)
CO2: 28 mmol/L (ref 20–32)
Calcium: 9.6 mg/dL (ref 8.6–10.4)
Chloride: 105 mmol/L (ref 98–110)
Creat: 0.84 mg/dL (ref 0.50–0.99)
GFR, Est African American: 83 mL/min/{1.73_m2} (ref >=60)
GFR, Est Non African American: 72 mL/min/{1.73_m2} (ref >=60)
Globulin: 3 g/dL (calc) (ref 1.9–3.7)
Glucose, Bld: 94 mg/dL (ref 65–99)
Potassium: 4.4 mmol/L (ref 3.5–5.3)
Sodium: 141 mmol/L (ref 135–146)
Total Bilirubin: 0.4 mg/dL (ref 0.2–1.2)
Total Protein: 7.3 g/dL (ref 6.1–8.1)

## 2018-09-18 LAB — VITAMIN D 25 HYDROXY (VIT D DEFICIENCY, FRACTURES): Vit D, 25-Hydroxy: 45 ng/mL (ref 30–100)

## 2018-09-18 LAB — CBC
HCT: 38.9 % (ref 35.0–45.0)
Hemoglobin: 13.1 g/dL (ref 11.7–15.5)
MCH: 30 pg (ref 27.0–33.0)
MCHC: 33.7 g/dL (ref 32.0–36.0)
MCV: 89.2 fL (ref 80.0–100.0)
MPV: 9.9 fL (ref 7.5–12.5)
Platelets: 269 10*3/uL (ref 140–400)
RBC: 4.36 10*6/uL (ref 3.80–5.10)
RDW: 12.7 % (ref 11.0–15.0)
WBC: 6.2 10*3/uL (ref 3.8–10.8)

## 2018-09-18 LAB — LIPID PANEL W/REFLEX DIRECT LDL
Cholesterol: 173 mg/dL (ref <200)
HDL: 56 mg/dL (ref >=50)
LDL Cholesterol (Calc): 97 mg/dL (calc)
Non-HDL Cholesterol (Calc): 117 mg/dL (calc) (ref <130)
Total CHOL/HDL Ratio: 3.1 (calc) (ref <5.0)
Triglycerides: 106 mg/dL (ref <150)

## 2018-09-18 LAB — HEPATITIS C ANTIBODY
Hepatitis C Ab: NONREACTIVE
SIGNAL TO CUT-OFF: 0.02 (ref <1.00)

## 2018-09-18 LAB — TSH: TSH: 1.27 mIU/L (ref 0.40–4.50)

## 2018-09-26 ENCOUNTER — Other Ambulatory Visit: Payer: Self-pay | Admitting: *Deleted

## 2018-09-26 MED ORDER — DEXILANT 60 MG PO CPDR: 1.0000 | DELAYED_RELEASE_CAPSULE | Freq: Every day | ORAL | 3 refills | Status: DC

## 2018-10-01 ENCOUNTER — Ambulatory Visit (INDEPENDENT_AMBULATORY_CARE_PROVIDER_SITE_OTHER): Payer: Medicare Other

## 2018-10-01 ENCOUNTER — Other Ambulatory Visit: Payer: Self-pay

## 2018-10-01 ENCOUNTER — Other Ambulatory Visit: Payer: Self-pay | Admitting: Sports Medicine

## 2018-10-01 DIAGNOSIS — M8580 Other specified disorders of bone density and structure, unspecified site: Secondary | ICD-10-CM

## 2018-10-01 DIAGNOSIS — Z0000 Encounter for general adult medical examination without abnormal findings: Secondary | ICD-10-CM

## 2018-10-01 DIAGNOSIS — Z13820 Encounter for screening for osteoporosis: Secondary | ICD-10-CM

## 2018-10-01 MED ORDER — CALCIUM CARBONATE-VITAMIN D 600-400 MG-UNIT PO TABS: 1.0000 | ORAL_TABLET | Freq: Two times a day (BID) | ORAL | 11 refills | Status: DC

## 2018-10-03 ENCOUNTER — Encounter: Payer: Self-pay | Admitting: Sports Medicine

## 2018-10-21 ENCOUNTER — Encounter: Payer: Self-pay | Admitting: Sports Medicine

## 2018-10-24 ENCOUNTER — Telehealth (INDEPENDENT_AMBULATORY_CARE_PROVIDER_SITE_OTHER): Payer: Medicare Other | Admitting: Sports Medicine

## 2018-10-24 DIAGNOSIS — R635 Abnormal weight gain: Secondary | ICD-10-CM

## 2018-10-24 NOTE — Assessment & Plan Note (Signed)
Herman was doing well until the end of 2016, she had a pulmonary embolism and had to stop her hormones, she gained about 15 to 19 pounds. Now she has had significant trouble losing this weight, she has tried 16-hour intermittent fasting without any weight loss, she is agreeable to get a personal trainer and work on core and abdominal exercises, she will cut her calorie intake back to 1400 cal, if insufficient improvement after 4 to 6 months then it is reasonable for her to consider a consultation with Psychiatric nurse.

## 2018-10-24 NOTE — Progress Notes (Signed)
Virtual Visit via WebEx/MyChart   I connected with  [place]  on 10/24/18 via WebEx/MyChart/Doximity Video and verified that I am speaking with the correct person using two identifiers.   I discussed the limitations, risks, security and privacy concerns of performing an evaluation and management service by WebEx/MyChart/Doximity Video, including the higher likelihood of inaccurate diagnosis and treatment, and the availability of in person appointments.  We also discussed the likely need of an additional face to face encounter for complete and high quality delivery of care.  I also discussed with the patient that there may be a patient responsible charge related to this service. The patient expressed understanding and wishes to proceed.  Provider location is either at home or medical facility. Patient location is at their home, different from provider location. People involved in care of the patient during this telehealth encounter were myself, my nurse/medical assistant, and my front office/scheduling team member.  Subjective:    CC: Abnormal weight gain  HPI: Kathleen Garcia is gained some weight after stopping her hormones secondary to PE, she like to discuss ways to improve her problem area, her abdomen.  I reviewed the past medical history, family history, social history, surgical history, and allergies today and no changes were needed.  Please see the problem list section below in epic for further details.  Past Medical History: Past Medical History:  Diagnosis Date  . Cervical spondylosis     with disk protrusion  . Chest pain   . Diarrhea    The history was provided by the patient  . Family history of breast cancer   . Family history of breast cancer gene mutation in first degree relative   . Family history of leukemia   . Family history of pancreatic cancer   . Family history of prostate cancer   . Fluttering heart   . Irregular heart rate   . Irritable bowel syndrome    . Osteomyelitis ([place])    middle phalanx, right index finger  . Osteoporosis   . Palpitation   . Trigeminal neuralgia   . Vomiting    The history was provided by the patient   Past Surgical History: Past Surgical History:  Procedure Laterality Date  . CERVICAL DISC SURGERY    . COLONOSCOPY     with biopsies  . KNEE SURGERY    . TONSILLECTOMY    . TUBAL LIGATION    . UPPER GASTROINTESTINAL ENDOSCOPY      with biopsies   Social History: Social History   Socioeconomic History  . Marital status: Married    Spouse name: Not on file  . Number of children: Not on file  . Years of education: Not on file  . Highest education level: Not on file  Occupational History  . Not on file  Social Needs  . Financial resource strain: Not on file  . Food insecurity    Worry: Not on file    Inability: Not on file  . Transportation needs    Medical: Not on file    Non-medical: Not on file  Tobacco Use  . Smoking status: Never Smoker  . Smokeless tobacco: Never Used  Substance and Sexual Activity  . Alcohol use: No  . Drug use: No  . Sexual activity: Never    Birth control/protection: Surgical  Lifestyle  . Physical activity    Days per week: Not on file    Minutes per session: Not on file  . Stress: Not on file  Relationships  . Social Musician on phone: Not on file    Gets together: Not on file    Attends religious service: Not on file    Active member of club or organization: Not on file    Attends meetings of clubs or organizations: Not on file    Relationship status: Not on file  Other Topics Concern  . Not on file  Social History Narrative  . Not on file   Family History: Family History  Problem Relation Age of Onset  . Hypertension Mother   . Coronary artery disease Father   . Breast cancer Sister 63  . Prostate cancer Brother 30  . Pancreatic cancer Maternal Aunt 53  . Breast cancer Cousin        dx 60s   Allergies: Allergies  Allergen  Reactions  . Morphine Anaphylaxis    Swelling   . Azithromycin Nausea And Vomiting  . Betadine [Povidone Iodine]   . Keflex [Cephalexin] Nausea And Vomiting  . Povidone-Iodine   . Promethazine Hcl   . Xarelto [Rivaroxaban] Other (See Comments)    Severe nausea  . Septra [Sulfamethoxazole-Trimethoprim] Rash   Medications: See med rec.  Review of Systems: No fevers, chills, night sweats, weight loss, chest pain, or shortness of breath.   Objective:    General: Speaking full sentences, no audible heavy breathing.  Sounds alert and appropriately interactive.  Appears well.  Face symmetric.  Extraocular movements intact.  Pupils equal and round.  No nasal flaring or accessory muscle use visualized.  No other physical exam performed due to the non-physical nature of this visit.  Impression and Recommendations:    Abnormal weight gain Kathleen Garcia was doing well until the end of 2016, she had a pulmonary embolism and had to stop her hormones, she gained about 15 to 19 pounds. Now she has had significant trouble losing this weight, she has tried 16-hour intermittent fasting without any weight loss, she is agreeable to get a personal trainer and work on core and abdominal exercises, she will cut her calorie intake back to 1400 cal, if insufficient improvement after 4 to 6 months then it is reasonable for her to consider a consultation with Engineer, petroleum.   I discussed the above assessment and treatment plan with the patient. The patient was provided an opportunity to ask questions and all were answered. The patient agreed with the plan and demonstrated an understanding of the instructions.   The patient was advised to call back or seek an in-person evaluation if the symptoms worsen or if the condition fails to improve as anticipated.   I provided 25 minutes of non-face-to-face time during this encounter, 15 minutes of additional time was needed to gather information, review chart, records,  communicate/coordinate with staff remotely, troubleshooting the multiple errors that we get every time when trying to do video calls through the electronic medical record, WebEx, and Doximity, restart the encounter multiple times due to instability of the software, as well as complete documentation.   ___________________________________________ Ihor Austin. Benjamin Stain, M.D., ABFM., CAQSM. Primary Care and Sports Medicine [facility]  Adjunct Professor of Family Medicine  [employer]

## 2018-11-15 ENCOUNTER — Other Ambulatory Visit: Payer: Self-pay | Admitting: Sports Medicine

## 2018-11-16 ENCOUNTER — Encounter: Payer: Self-pay | Admitting: Sports Medicine

## 2018-11-17 MED ORDER — TRIAMCINOLONE ACETONIDE 0.5 % EX OINT: 1.0000 "application " | TOPICAL_OINTMENT | Freq: Two times a day (BID) | CUTANEOUS | 3 refills | Status: DC

## 2018-11-17 NOTE — Telephone Encounter (Signed)
RX last written 08/18/18 for #30g with 3 RF  RX pended, please send if appropriate

## 2018-11-20 ENCOUNTER — Other Ambulatory Visit: Payer: Self-pay | Admitting: Sports Medicine

## 2018-11-20 DIAGNOSIS — R002 Palpitations: Secondary | ICD-10-CM

## 2019-01-13 DIAGNOSIS — L821 Other seborrheic keratosis: Secondary | ICD-10-CM | POA: Diagnosis not present

## 2019-01-13 DIAGNOSIS — L570 Actinic keratosis: Secondary | ICD-10-CM | POA: Diagnosis not present

## 2019-01-13 DIAGNOSIS — L814 Other melanin hyperpigmentation: Secondary | ICD-10-CM | POA: Diagnosis not present

## 2019-01-13 DIAGNOSIS — D225 Melanocytic nevi of trunk: Secondary | ICD-10-CM | POA: Diagnosis not present

## 2019-01-13 DIAGNOSIS — L820 Inflamed seborrheic keratosis: Secondary | ICD-10-CM | POA: Diagnosis not present

## 2019-01-13 DIAGNOSIS — L920 Granuloma annulare: Secondary | ICD-10-CM | POA: Diagnosis not present

## 2019-01-13 DIAGNOSIS — D1801 Hemangioma of skin and subcutaneous tissue: Secondary | ICD-10-CM | POA: Diagnosis not present

## 2019-01-23 ENCOUNTER — Telehealth: Payer: Self-pay | Admitting: Sports Medicine

## 2019-01-23 NOTE — Telephone Encounter (Signed)
We just sent another form earlier today for appeal I am guessing.

## 2019-01-23 NOTE — Telephone Encounter (Signed)
Patient sent a mychart that her Dexilant needs a tier exception for this year. I have sent information to insurance and I am waiting on a response.

## 2019-01-26 ENCOUNTER — Other Ambulatory Visit: Payer: Self-pay | Admitting: Sports Medicine

## 2019-01-27 DIAGNOSIS — H5203 Hypermetropia, bilateral: Secondary | ICD-10-CM | POA: Diagnosis not present

## 2019-01-27 DIAGNOSIS — H2513 Age-related nuclear cataract, bilateral: Secondary | ICD-10-CM | POA: Diagnosis not present

## 2019-01-27 DIAGNOSIS — H04123 Dry eye syndrome of bilateral lacrimal glands: Secondary | ICD-10-CM | POA: Diagnosis not present

## 2019-01-30 NOTE — Telephone Encounter (Signed)
I have faxed the tier exception letter. Waiting on a response.

## 2019-02-02 NOTE — Telephone Encounter (Signed)
Received fax from [facility] they are still denying teir exception on medication placed in providers box for review. - CF

## 2019-02-24 ENCOUNTER — Encounter: Payer: Self-pay | Admitting: Sports Medicine

## 2019-02-24 DIAGNOSIS — Z23 Encounter for immunization: Secondary | ICD-10-CM | POA: Insufficient documentation

## 2019-03-30 DIAGNOSIS — H11123 Conjunctival concretions, bilateral: Secondary | ICD-10-CM | POA: Diagnosis not present

## 2019-04-13 DIAGNOSIS — H11123 Conjunctival concretions, bilateral: Secondary | ICD-10-CM | POA: Diagnosis not present

## 2019-04-15 ENCOUNTER — Other Ambulatory Visit: Payer: Self-pay | Admitting: Sports Medicine

## 2019-04-30 ENCOUNTER — Other Ambulatory Visit: Payer: Self-pay | Admitting: Sports Medicine

## 2019-04-30 DIAGNOSIS — Z1231 Encounter for screening mammogram for malignant neoplasm of breast: Secondary | ICD-10-CM

## 2019-05-13 ENCOUNTER — Other Ambulatory Visit: Payer: Self-pay | Admitting: Sports Medicine

## 2019-05-13 DIAGNOSIS — R002 Palpitations: Secondary | ICD-10-CM

## 2019-06-02 ENCOUNTER — Other Ambulatory Visit: Payer: Self-pay

## 2019-06-02 ENCOUNTER — Ambulatory Visit
Admission: RE | Admit: 2019-06-02 | Discharge: 2019-06-02 | Disposition: A | Discharge status: Home / Self Care | Payer: Medicare Other | Source: Ambulatory Visit | Attending: Sports Medicine | Admitting: Sports Medicine

## 2019-06-02 DIAGNOSIS — Z1231 Encounter for screening mammogram for malignant neoplasm of breast: Secondary | ICD-10-CM | POA: Diagnosis not present

## 2019-06-13 ENCOUNTER — Other Ambulatory Visit: Payer: Self-pay | Admitting: Sports Medicine

## 2019-06-13 DIAGNOSIS — K580 Irritable bowel syndrome with diarrhea: Secondary | ICD-10-CM

## 2019-08-20 DIAGNOSIS — Z6826 Body mass index (BMI) 26.0-26.9, adult: Secondary | ICD-10-CM | POA: Diagnosis not present

## 2019-08-20 DIAGNOSIS — Z124 Encounter for screening for malignant neoplasm of cervix: Secondary | ICD-10-CM | POA: Diagnosis not present

## 2019-08-24 LAB — HM PAP SMEAR

## 2019-10-07 ENCOUNTER — Encounter: Payer: Self-pay | Admitting: Sports Medicine

## 2019-10-07 ENCOUNTER — Ambulatory Visit (INDEPENDENT_AMBULATORY_CARE_PROVIDER_SITE_OTHER): Payer: Medicare PPO | Admitting: Sports Medicine

## 2019-10-07 VITALS — BP 95/64 | HR 67 | Ht 61.0 in | Wt 143.0 lb

## 2019-10-07 DIAGNOSIS — R635 Abnormal weight gain: Secondary | ICD-10-CM | POA: Diagnosis not present

## 2019-10-07 DIAGNOSIS — Z0000 Encounter for general adult medical examination without abnormal findings: Secondary | ICD-10-CM | POA: Diagnosis not present

## 2019-10-07 NOTE — Progress Notes (Addendum)
Subjective:    CC: Annual Physical Exam  HPI:  This patient is here for their annual physical  I reviewed the past medical history, family history, social history, surgical history, and allergies today and no changes were needed.  Please see the problem list section below in epic for further details.  Past Medical History: Past Medical History:  Diagnosis Date  . Cervical spondylosis     with disk protrusion  . Chest pain   . Diarrhea    The history was provided by the patient  . Family history of breast cancer   . Family history of breast cancer gene mutation in first degree relative   . Family history of leukemia   . Family history of pancreatic cancer   . Family history of prostate cancer   . Fluttering heart   . Irregular heart rate   . Irritable bowel syndrome   . Osteomyelitis (HCC)    middle phalanx, right index finger  . Osteoporosis   . Palpitation   . Trigeminal neuralgia   . Vomiting    The history was provided by the patient   Past Surgical History: Past Surgical History:  Procedure Laterality Date  . CERVICAL DISC SURGERY    . COLONOSCOPY     with biopsies  . KNEE SURGERY    . TONSILLECTOMY    . TUBAL LIGATION    . UPPER GASTROINTESTINAL ENDOSCOPY      with biopsies   Social History: Social History   Socioeconomic History  . Marital status: Married    Spouse name: Not on file  . Number of children: Not on file  . Years of education: Not on file  . Highest education level: Not on file  Occupational History  . Not on file  Tobacco Use  . Smoking status: Never Smoker  . Smokeless tobacco: Never Used  Substance and Sexual Activity  . Alcohol use: No  . Drug use: No  . Sexual activity: Never    Birth control/protection: Surgical  Other Topics Concern  . Not on file  Social History Narrative  . Not on file   Social Determinants of Health   Financial Resource Strain:   . Difficulty of Paying Living Expenses: Not on file  Food  Insecurity:   . Worried About Programme researcher, broadcasting/film/video in the Last Year: Not on file  . Ran Out of Food in the Last Year: Not on file  Transportation Needs:   . Lack of Transportation (Medical): Not on file  . Lack of Transportation (Non-Medical): Not on file  Physical Activity:   . Days of Exercise per Week: Not on file  . Minutes of Exercise per Session: Not on file  Stress:   . Feeling of Stress : Not on file  Social Connections:   . Frequency of Communication with Friends and Family: Not on file  . Frequency of Social Gatherings with Friends and Family: Not on file  . Attends Religious Services: Not on file  . Active Member of Clubs or Organizations: Not on file  . Attends Banker Meetings: Not on file  . Marital Status: Not on file   Family History: Family History  Problem Relation Age of Onset  . Hypertension Mother   . Coronary artery disease Father   . Breast cancer Sister 59  . Prostate cancer Brother 8  . Pancreatic cancer Maternal Aunt 53  . Breast cancer Cousin        dx 4s  Allergies: Allergies  Allergen Reactions  . Morphine Anaphylaxis    Swelling   . Azithromycin Nausea And Vomiting  . Betadine [Povidone Iodine]   . Keflex [Cephalexin] Nausea And Vomiting  . Povidone-Iodine   . Promethazine Hcl   . Xarelto [Rivaroxaban] Other (See Comments)    Severe nausea  . Septra [Sulfamethoxazole-Trimethoprim] Rash   Medications: See med rec.  Review of Systems: No headache, visual changes, nausea, vomiting, diarrhea, constipation, dizziness, abdominal pain, skin rash, fevers, chills, night sweats, swollen lymph nodes, weight loss, chest pain, body aches, joint swelling, muscle aches, shortness of breath, mood changes, visual or auditory hallucinations.  Objective:    General: Well Developed, well nourished, and in no acute distress.  Neuro: Alert and oriented x3, extra-ocular muscles intact, sensation grossly intact. Cranial nerves II through XII are  intact, motor, sensory, and coordinative functions are all intact. HEENT: Normocephalic, atraumatic, pupils equal round reactive to light, neck supple, no masses, no lymphadenopathy, thyroid nonpalpable. Oropharynx, nasopharynx, external ear canals are unremarkable. Skin: Warm and dry, no rashes noted.  Cardiac: Regular rate and rhythm, no murmurs rubs or gallops.  Respiratory: Clear to auscultation bilaterally. Not using accessory muscles, speaking in full sentences.  Abdominal: Soft, nontender, nondistended, positive bowel sounds, no masses, no organomegaly.  Musculoskeletal: Shoulder, elbow, wrist, hip, knee, ankle stable, and with full range of motion.  Impression and Recommendations:    The patient was counselled, risk factors were discussed, anticipatory guidance given.  Annual physical exam Annual physical as above, declines vaccinations today, but she is willing to return in a nurse visit for her flu shot, she tells me she would never want Tdap or Shingrix. She is up-to-date on colon cancer screening.  She had a positive Cologuard in 2018 and a unremarkable colonoscopy in 2019.  Abnormal weight gain Referral to healthy weight and wellness center.   ___________________________________________ Ihor Austin. Benjamin Stain, M.D., ABFM., CAQSM. Primary Care and Sports Medicine [facility]  Adjunct Professor of Family Medicine  [employer]

## 2019-10-07 NOTE — Assessment & Plan Note (Addendum)
Annual physical as above, declines vaccinations today, but she is willing to return in a nurse visit for her flu shot, she tells me she would never want Tdap or Shingrix. She is up-to-date on colon cancer screening.  She had a positive Cologuard in 2018 and a unremarkable colonoscopy in 2019.

## 2019-10-07 NOTE — Assessment & Plan Note (Signed)
Referral to healthy weight and wellness center.

## 2019-10-07 NOTE — Addendum Note (Signed)
Addended by: Monica Becton on: 10/07/2019 11:37 AM   Modules accepted: Orders

## 2019-10-08 LAB — CBC
HCT: 39.4 % (ref 35.0–45.0)
Hemoglobin: 13.2 g/dL (ref 11.7–15.5)
MCH: 30 pg (ref 27.0–33.0)
MCHC: 33.5 g/dL (ref 32.0–36.0)
MCV: 89.5 fL (ref 80.0–100.0)
MPV: 10 fL (ref 7.5–12.5)
Platelets: 292 10*3/uL (ref 140–400)
RBC: 4.4 10*6/uL (ref 3.80–5.10)
RDW: 12.7 % (ref 11.0–15.0)
WBC: 5.7 10*3/uL (ref 3.8–10.8)

## 2019-10-08 LAB — COMPREHENSIVE METABOLIC PANEL
AG Ratio: 1.3 (calc) (ref 1.0–2.5)
ALT: 14 U/L (ref 6–29)
AST: 22 U/L (ref 10–35)
Albumin: 4.2 g/dL (ref 3.6–5.1)
Alkaline phosphatase (APISO): 85 U/L (ref 37–153)
BUN: 10 mg/dL (ref 7–25)
CO2: 29 mmol/L (ref 20–32)
Calcium: 9.6 mg/dL (ref 8.6–10.4)
Chloride: 103 mmol/L (ref 98–110)
Creat: 0.84 mg/dL (ref 0.50–0.99)
Globulin: 3.2 g/dL (calc) (ref 1.9–3.7)
Glucose, Bld: 87 mg/dL (ref 65–99)
Potassium: 4.2 mmol/L (ref 3.5–5.3)
Sodium: 140 mmol/L (ref 135–146)
Total Bilirubin: 0.3 mg/dL (ref 0.2–1.2)
Total Protein: 7.4 g/dL (ref 6.1–8.1)

## 2019-10-08 LAB — TSH: TSH: 1.15 mIU/L (ref 0.40–4.50)

## 2019-10-08 LAB — LIPID PANEL
Cholesterol: 181 mg/dL (ref <200)
HDL: 68 mg/dL (ref >=50)
LDL Cholesterol (Calc): 95 mg/dL (calc)
Non-HDL Cholesterol (Calc): 113 mg/dL (calc) (ref <130)
Total CHOL/HDL Ratio: 2.7 (calc) (ref <5.0)
Triglycerides: 88 mg/dL (ref <150)

## 2019-10-19 ENCOUNTER — Other Ambulatory Visit: Payer: Self-pay | Admitting: Sports Medicine

## 2019-11-01 ENCOUNTER — Other Ambulatory Visit: Payer: Self-pay | Admitting: Sports Medicine

## 2019-11-06 ENCOUNTER — Other Ambulatory Visit: Payer: Self-pay | Admitting: Sports Medicine

## 2019-11-06 DIAGNOSIS — R002 Palpitations: Secondary | ICD-10-CM

## 2019-12-09 DIAGNOSIS — M79661 Pain in right lower leg: Secondary | ICD-10-CM | POA: Diagnosis not present

## 2019-12-10 DIAGNOSIS — R202 Paresthesia of skin: Secondary | ICD-10-CM | POA: Insufficient documentation

## 2019-12-10 DIAGNOSIS — M79661 Pain in right lower leg: Secondary | ICD-10-CM | POA: Insufficient documentation

## 2019-12-10 DIAGNOSIS — R200 Anesthesia of skin: Secondary | ICD-10-CM | POA: Insufficient documentation

## 2019-12-10 MED ORDER — MAGNESIUM OXIDE 400 MG PO TABS: 800.0000 mg | ORAL_TABLET | Freq: Every day | ORAL | 3 refills | Status: DC

## 2019-12-10 MED ORDER — PREDNISONE 50 MG PO TABS: ORAL_TABLET | ORAL | 0 refills | Status: DC

## 2019-12-10 NOTE — Assessment & Plan Note (Addendum)
Calf pain can mean a lot of things Kathleen Garcia, sometimes it comes from the lumbar spine, another major concern is that it could be a blood clot.  I am going to go ahead and order a 5-day burst of prednisone which would knock out pain coming from your lumbar spine, magnesium oxide at night will also help, but I am also going to order a DVT ultrasound of your right leg.  I spent 5 total minutes of online digital evaluation and management services.

## 2019-12-15 ENCOUNTER — Ambulatory Visit (INDEPENDENT_AMBULATORY_CARE_PROVIDER_SITE_OTHER): Payer: Medicare PPO

## 2019-12-15 ENCOUNTER — Other Ambulatory Visit: Payer: Self-pay

## 2019-12-15 DIAGNOSIS — M7989 Other specified soft tissue disorders: Secondary | ICD-10-CM | POA: Diagnosis not present

## 2019-12-15 DIAGNOSIS — R2241 Localized swelling, mass and lump, right lower limb: Secondary | ICD-10-CM

## 2019-12-15 DIAGNOSIS — M79661 Pain in right lower leg: Secondary | ICD-10-CM

## 2019-12-17 ENCOUNTER — Telehealth (INDEPENDENT_AMBULATORY_CARE_PROVIDER_SITE_OTHER): Payer: Medicare PPO | Admitting: Sports Medicine

## 2019-12-17 DIAGNOSIS — R202 Paresthesia of skin: Secondary | ICD-10-CM | POA: Diagnosis not present

## 2019-12-17 DIAGNOSIS — R200 Anesthesia of skin: Secondary | ICD-10-CM | POA: Diagnosis not present

## 2019-12-17 NOTE — Progress Notes (Signed)
° °  Virtual Visit via WebEx/MyChart   I connected with  Kathleen Garcia  on 12/17/19 via WebEx/MyChart/Doximity Video and verified that I am speaking with the correct person using two identifiers.   I discussed the limitations, risks, security and privacy concerns of performing an evaluation and management service by WebEx/MyChart/Doximity Video, including the higher likelihood of inaccurate diagnosis and treatment, and the availability of in person appointments.  We also discussed the likely need of an additional face to face encounter for complete and high quality delivery of care.  I also discussed with the patient that there may be a patient responsible charge related to this service. The patient expressed understanding and wishes to proceed.  Provider location is in medical facility. Patient location is at their home, different from provider location. People involved in care of the patient during this telehealth encounter were myself, my nurse/medical assistant, and my front office/scheduling team member.  Review of Systems: No fevers, chills, night sweats, weight loss, chest pain, or shortness of breath.   Objective Findings:    General: Speaking full sentences, no audible heavy breathing.  Sounds alert and appropriately interactive.  Appears well.  Face symmetric.  Extraocular movements intact.  Pupils equal and round.  No nasal flaring or accessory muscle use visualized.  Independent interpretation of tests performed by another provider:   None.  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 She has had this on the left side  as well and does have significant nocturnal cramps not much better with magnesium. All of this points to lumbar spinal stenosis in a radicular source of her discomfort. No progressive weakness or footdrop. Increasing gabapentin to 600 mg nightly and adding both piriformis syndrome and lumbar radiculitis rehabilitation exercises. Return to see me in 4 to 6 weeks, x-rays and MRI for interventional planning if no better.   I discussed the above assessment and treatment plan with the patient. The patient was provided an opportunity to ask questions and all were answered. The patient agreed with the plan and demonstrated an understanding of the instructions.   The patient was advised to call back or seek an in-person evaluation if the symptoms worsen or if the condition fails to improve as anticipated.   I provided 30 minutes of face to face and non-face-to-face time during this encounter date, time was needed to gather information, review chart, records, communicate/coordinate with staff remotely, as well as complete documentation.   ___________________________________________ Ihor Austin. Benjamin Stain, M.D., ABFM., CAQSM. Primary Care and Sports Medicine [place] [facility]  Adjunct Instructor of Family Medicine  [employer]

## 2019-12-17 NOTE — Assessment & Plan Note (Signed)
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 She has had this on the left side as well and does have significant nocturnal cramps not much better with magnesium. All of this points to lumbar spinal stenosis in a radicular source of her discomfort. No progressive weakness or footdrop. Increasing gabapentin to 600 mg nightly and adding both piriformis syndrome and lumbar radiculitis rehabilitation exercises. Return to see me in 4 to 6 weeks, x-rays and MRI for interventional planning if no better.

## 2019-12-17 NOTE — Progress Notes (Signed)
Bilateral (R>L) calf pain. [place] negative for DVT. Prednisone for 5 days, patient reports no change in symptoms. Tylenol seems to help symptoms some.  Not as noticeable during the day but worse at night. Using compression socks during the day. Massage helps.

## 2019-12-25 MED ORDER — GABAPENTIN 100 MG PO CAPS
ORAL_CAPSULE | ORAL | 3 refills | Status: DC
Start: 2019-12-25 — End: 2020-03-25

## 2020-01-05 DIAGNOSIS — U071 COVID-19: Secondary | ICD-10-CM | POA: Diagnosis not present

## 2020-01-05 MED ORDER — HYDROCOD POLST-CPM POLST ER 10-8 MG/5ML PO SUER: 5.0000 mL | Freq: Two times a day (BID) | ORAL | 0 refills | Status: DC | PRN

## 2020-01-05 NOTE — Telephone Encounter (Signed)
I spent 5 total minutes of online digital evaluation and management services. 

## 2020-01-06 ENCOUNTER — Telehealth: Payer: Medicare PPO | Admitting: Nurse Practitioner

## 2020-01-06 DIAGNOSIS — J029 Acute pharyngitis, unspecified: Secondary | ICD-10-CM

## 2020-01-06 MED ORDER — AZITHROMYCIN 200 MG/5ML PO SUSR: ORAL | 0 refills | Status: DC

## 2020-01-06 NOTE — Progress Notes (Signed)
We are sorry that you are not feeling well.  Here is how we plan to help!  Based on what you have shared with me it is likely that you have strep pharyngitis.  Strep pharyngitis is inflammation and infection in the back of the throat.  This is an infection cause by bacteria and is treated with antibiotics.  I have prescribed Azithromycin 250 mg two liquid today and then one daily for 4 additional days.( patient says not allergic just causes nausea if she takes 500mg  at a time- she says she can take it).  For throat pain, we recommend over the counter oral pain relief medications such as acetaminophen or aspirin, or anti-inflammatory medications such as ibuprofen or naproxen sodium. Topical treatments such as oral throat lozenges or sprays may be used as needed. Strep infections are not as easily transmitted as other respiratory infections, however we still recommend that you avoid close contact with loved ones, especially the very young and elderly.  Remember to wash your hands thoroughly throughout the day as this is the number one way to prevent the spread of infection and wipe down door knobs and counters with disinfectant.   Home Care:  Only take medications as instructed by your medical team.  Complete the entire course of an antibiotic.  Do not take these medications with alcohol.  A steam or ultrasonic humidifier can help congestion.  You can place a towel over your head and breathe in the steam from hot water coming from a faucet.  Avoid close contacts especially the very young and the elderly.  Cover your mouth when you cough or sneeze.  Always remember to wash your hands.  Get Help Right Away If:  You develop worsening fever or sinus pain.  You develop a severe head ache or visual changes.  Your symptoms persist after you have completed your treatment plan.  Make sure you  Understand these instructions.  Will watch your condition.  Will get help right away if you are not  doing well or get worse.  Your e-visit answers were reviewed by a board certified advanced clinical practitioner to complete your personal care plan.  Depending on the condition, your plan could have included both over the counter or prescription medications.  If there is a problem please reply  once you have received a response from your provider.  Your safety is important to .  If you have drug allergies check your prescription carefully.    You can use MyChart to ask questions about today's visit, request a non-urgent call back, or ask for a work or school excuse for 24 hours related to this e-Visit. If it has been greater than 24 hours you will need to follow up with your provider, or enter a new e-Visit to address those concerns.  You will get an e-mail in the next two days asking about your experience.  I hope that your e-visit has been valuable and will speed your recovery. Thank you for using e-visits.  5-10 minutes spent reviewing and documenting in chart.

## 2020-02-17 ENCOUNTER — Telehealth: Payer: Self-pay

## 2020-02-17 NOTE — Telephone Encounter (Signed)
Form scanned in & emailed to patient at [email or web address]

## 2020-02-17 NOTE — Telephone Encounter (Signed)
Patient sent a Mychart message requesting we submit a PA for Dexilant.  PA submitted; awaiting response.

## 2020-02-22 ENCOUNTER — Other Ambulatory Visit: Payer: Self-pay | Admitting: Sports Medicine

## 2020-02-22 DIAGNOSIS — M79661 Pain in right lower leg: Secondary | ICD-10-CM

## 2020-02-22 MED ORDER — MAGNESIUM OXIDE 400 MG PO TABS: 800.0000 mg | ORAL_TABLET | Freq: Every day | ORAL | 3 refills | Status: DC

## 2020-03-24 ENCOUNTER — Other Ambulatory Visit: Payer: Self-pay | Admitting: Sports Medicine

## 2020-03-24 DIAGNOSIS — L570 Actinic keratosis: Secondary | ICD-10-CM | POA: Diagnosis not present

## 2020-03-24 DIAGNOSIS — D2262 Melanocytic nevi of left upper limb, including shoulder: Secondary | ICD-10-CM | POA: Diagnosis not present

## 2020-03-24 DIAGNOSIS — L814 Other melanin hyperpigmentation: Secondary | ICD-10-CM | POA: Diagnosis not present

## 2020-03-24 DIAGNOSIS — L821 Other seborrheic keratosis: Secondary | ICD-10-CM | POA: Diagnosis not present

## 2020-03-24 DIAGNOSIS — D1801 Hemangioma of skin and subcutaneous tissue: Secondary | ICD-10-CM | POA: Diagnosis not present

## 2020-03-24 DIAGNOSIS — L304 Erythema intertrigo: Secondary | ICD-10-CM | POA: Diagnosis not present

## 2020-03-25 ENCOUNTER — Other Ambulatory Visit: Payer: Self-pay

## 2020-03-25 ENCOUNTER — Ambulatory Visit: Payer: Medicare PPO | Admitting: Sports Medicine

## 2020-03-25 ENCOUNTER — Ambulatory Visit (INDEPENDENT_AMBULATORY_CARE_PROVIDER_SITE_OTHER): Payer: Medicare PPO

## 2020-03-25 DIAGNOSIS — R202 Paresthesia of skin: Secondary | ICD-10-CM | POA: Diagnosis not present

## 2020-03-25 DIAGNOSIS — M545 Low back pain, unspecified: Secondary | ICD-10-CM | POA: Diagnosis not present

## 2020-03-25 DIAGNOSIS — R200 Anesthesia of skin: Secondary | ICD-10-CM | POA: Diagnosis not present

## 2020-03-25 MED ORDER — PREGABALIN 75 MG PO CAPS
ORAL_CAPSULE | ORAL | 3 refills | Status: DC
Start: 2020-03-25 — End: 2020-04-26

## 2020-03-25 NOTE — Progress Notes (Signed)
    Procedures performed today:    None.  Independent interpretation of notes and tests performed by another provider:   None.  Brief History, Exam, Impression, and Recommendations:    Numbness and tingling of right leg Del returns, we have been treating her now for a few months for numbness and tingling of the right leg, she did some gabapentin, magnesium without much improvement, home rehab exercises. We did a DVT ultrasound that was negative. I do think this is right lumbar radiculitis, at this point we will proceed with formal PT, switch from gabapentin to Lyrica as she is experiencing some excessive sedation. X-rays. Return to see me in 4 weeks, MRI for interventional planning +/- SI joint injection if no better.    ___________________________________________ Ihor Austin. Benjamin Stain, M.D., ABFM., CAQSM. Primary Care and Sports Medicine [place] [facility]  Adjunct Instructor of Family Medicine  [employer]

## 2020-03-25 NOTE — Assessment & Plan Note (Signed)
Kathleen Garcia returns, we have been treating her now for a few months for numbness and tingling of the right leg, she did some gabapentin, magnesium without much improvement, home rehab exercises. We did a DVT ultrasound that was negative. I do think this is right lumbar radiculitis, at this point we will proceed with formal PT, switch from gabapentin to Lyrica as she is experiencing some excessive sedation. X-rays. Return to see me in 4 weeks, MRI for interventional planning +/- SI joint injection if no better.

## 2020-03-30 DIAGNOSIS — S39012A Strain of muscle, fascia and tendon of lower back, initial encounter: Secondary | ICD-10-CM | POA: Diagnosis not present

## 2020-03-30 DIAGNOSIS — R202 Paresthesia of skin: Secondary | ICD-10-CM | POA: Diagnosis not present

## 2020-03-30 DIAGNOSIS — R200 Anesthesia of skin: Secondary | ICD-10-CM | POA: Diagnosis not present

## 2020-03-30 DIAGNOSIS — M6281 Muscle weakness (generalized): Secondary | ICD-10-CM | POA: Diagnosis not present

## 2020-04-04 DIAGNOSIS — S39012A Strain of muscle, fascia and tendon of lower back, initial encounter: Secondary | ICD-10-CM | POA: Diagnosis not present

## 2020-04-04 DIAGNOSIS — R202 Paresthesia of skin: Secondary | ICD-10-CM | POA: Diagnosis not present

## 2020-04-04 DIAGNOSIS — M6281 Muscle weakness (generalized): Secondary | ICD-10-CM | POA: Diagnosis not present

## 2020-04-04 DIAGNOSIS — R200 Anesthesia of skin: Secondary | ICD-10-CM | POA: Diagnosis not present

## 2020-04-06 DIAGNOSIS — M6281 Muscle weakness (generalized): Secondary | ICD-10-CM | POA: Diagnosis not present

## 2020-04-06 DIAGNOSIS — R200 Anesthesia of skin: Secondary | ICD-10-CM | POA: Diagnosis not present

## 2020-04-06 DIAGNOSIS — S39012A Strain of muscle, fascia and tendon of lower back, initial encounter: Secondary | ICD-10-CM | POA: Diagnosis not present

## 2020-04-06 DIAGNOSIS — R202 Paresthesia of skin: Secondary | ICD-10-CM | POA: Diagnosis not present

## 2020-04-12 DIAGNOSIS — M5442 Lumbago with sciatica, left side: Secondary | ICD-10-CM | POA: Diagnosis not present

## 2020-04-12 DIAGNOSIS — M545 Low back pain, unspecified: Secondary | ICD-10-CM | POA: Diagnosis not present

## 2020-04-14 DIAGNOSIS — R202 Paresthesia of skin: Secondary | ICD-10-CM | POA: Diagnosis not present

## 2020-04-14 DIAGNOSIS — S39012A Strain of muscle, fascia and tendon of lower back, initial encounter: Secondary | ICD-10-CM | POA: Diagnosis not present

## 2020-04-14 DIAGNOSIS — R200 Anesthesia of skin: Secondary | ICD-10-CM | POA: Diagnosis not present

## 2020-04-14 DIAGNOSIS — M6281 Muscle weakness (generalized): Secondary | ICD-10-CM | POA: Diagnosis not present

## 2020-04-19 DIAGNOSIS — M6281 Muscle weakness (generalized): Secondary | ICD-10-CM | POA: Diagnosis not present

## 2020-04-19 DIAGNOSIS — S39012A Strain of muscle, fascia and tendon of lower back, initial encounter: Secondary | ICD-10-CM | POA: Diagnosis not present

## 2020-04-19 DIAGNOSIS — R200 Anesthesia of skin: Secondary | ICD-10-CM | POA: Diagnosis not present

## 2020-04-19 DIAGNOSIS — R202 Paresthesia of skin: Secondary | ICD-10-CM | POA: Diagnosis not present

## 2020-04-22 ENCOUNTER — Ambulatory Visit: Payer: Medicare PPO | Admitting: Sports Medicine

## 2020-04-25 ENCOUNTER — Other Ambulatory Visit: Payer: Self-pay | Admitting: Sports Medicine

## 2020-04-25 DIAGNOSIS — R002 Palpitations: Secondary | ICD-10-CM

## 2020-04-26 ENCOUNTER — Ambulatory Visit: Payer: Medicare PPO | Admitting: Sports Medicine

## 2020-04-26 ENCOUNTER — Other Ambulatory Visit: Payer: Self-pay

## 2020-04-26 DIAGNOSIS — M5416 Radiculopathy, lumbar region: Secondary | ICD-10-CM

## 2020-04-26 MED ORDER — TRAMADOL HCL 50 MG PO TABS
50.0000 mg | ORAL_TABLET | Freq: Three times a day (TID) | ORAL | 0 refills | Status: DC | PRN
Start: 2020-04-26 — End: 2020-07-15

## 2020-04-26 MED ORDER — GABAPENTIN 100 MG PO CAPS
ORAL_CAPSULE | ORAL | 11 refills | Status: DC
Start: 2020-04-26 — End: 2020-08-10

## 2020-04-26 MED ORDER — GABAPENTIN 300 MG PO CAPS
ORAL_CAPSULE | ORAL | 11 refills | Status: DC
Start: 2020-04-26 — End: 2020-08-10

## 2020-04-26 NOTE — Addendum Note (Signed)
Addended by: Monica Becton on: 04/26/2020 04:46 PM   Modules accepted: Orders

## 2020-04-26 NOTE — Progress Notes (Addendum)
    Procedures performed today:    None.  Independent interpretation of notes and tests performed by another provider:   MRI personally reviewed, the right L5 nerve root does appear to have some compression in the foramen due to disc protrusion and facet synovial cysts.  Brief History, Exam, Impression, and Recommendations:    Right lumbar radiculitis This is a pleasant 70 year old female, she has axial low back pain, discogenic with radiation down the right leg in an L5 distribution. She was seen at [facility] orthopedics and had an unguided SI joint injection that provided some temporary relief. She is doing formal physical therapy, at [facility] orthopedics and has had about 3 weeks so far. She does improve slightly, we are going to switch her back to gabapentin, she tells me this did better than her Lyrica. I do think she needs an additional 3 weeks of physical therapy before we consider an MRI for epidural planning, she does prefer that if we do an MRI it be with Dr. Regino Schultze at [facility] orthopedics.    ___________________________________________ Ihor Austin. Benjamin Stain, M.D., ABFM., CAQSM. Primary Care and Sports Medicine [place] [facility]  Adjunct Instructor of Family Medicine  [employer]

## 2020-04-26 NOTE — Assessment & Plan Note (Signed)
This is a pleasant 70 year old female, she has axial low back pain, discogenic with radiation down the right leg in an L5 distribution. She was seen at [facility] orthopedics and had an unguided SI joint injection that provided some temporary relief. She is doing formal physical therapy, at [facility] orthopedics and has had about 3 weeks so far. She does improve slightly, we are going to switch her back to gabapentin, she tells me this did better than her Lyrica. I do think she needs an additional 3 weeks of physical therapy before we consider an MRI for epidural planning, she does prefer that if we do an MRI it be with Dr. Regino Schultze at [facility] orthopedics.

## 2020-04-27 DIAGNOSIS — M5416 Radiculopathy, lumbar region: Secondary | ICD-10-CM

## 2020-04-27 DIAGNOSIS — R200 Anesthesia of skin: Secondary | ICD-10-CM | POA: Diagnosis not present

## 2020-04-27 DIAGNOSIS — R202 Paresthesia of skin: Secondary | ICD-10-CM | POA: Diagnosis not present

## 2020-04-27 DIAGNOSIS — S39012A Strain of muscle, fascia and tendon of lower back, initial encounter: Secondary | ICD-10-CM | POA: Diagnosis not present

## 2020-04-27 DIAGNOSIS — M6281 Muscle weakness (generalized): Secondary | ICD-10-CM | POA: Diagnosis not present

## 2020-04-27 NOTE — Telephone Encounter (Signed)
Patient tells me she does not need to wait 6 weeks based on her discussion with her insurance company, MRI ordered.

## 2020-04-29 DIAGNOSIS — S39012A Strain of muscle, fascia and tendon of lower back, initial encounter: Secondary | ICD-10-CM | POA: Diagnosis not present

## 2020-04-29 DIAGNOSIS — R200 Anesthesia of skin: Secondary | ICD-10-CM | POA: Diagnosis not present

## 2020-04-29 DIAGNOSIS — M6281 Muscle weakness (generalized): Secondary | ICD-10-CM | POA: Diagnosis not present

## 2020-04-29 DIAGNOSIS — R202 Paresthesia of skin: Secondary | ICD-10-CM | POA: Diagnosis not present

## 2020-04-30 ENCOUNTER — Other Ambulatory Visit: Payer: Self-pay

## 2020-04-30 ENCOUNTER — Ambulatory Visit (INDEPENDENT_AMBULATORY_CARE_PROVIDER_SITE_OTHER): Payer: Medicare PPO

## 2020-04-30 DIAGNOSIS — M5416 Radiculopathy, lumbar region: Secondary | ICD-10-CM | POA: Diagnosis not present

## 2020-04-30 DIAGNOSIS — M545 Low back pain, unspecified: Secondary | ICD-10-CM | POA: Diagnosis not present

## 2020-05-02 ENCOUNTER — Other Ambulatory Visit: Payer: Self-pay | Admitting: Sports Medicine

## 2020-05-02 DIAGNOSIS — R200 Anesthesia of skin: Secondary | ICD-10-CM | POA: Diagnosis not present

## 2020-05-02 DIAGNOSIS — S39012A Strain of muscle, fascia and tendon of lower back, initial encounter: Secondary | ICD-10-CM | POA: Diagnosis not present

## 2020-05-02 DIAGNOSIS — M5416 Radiculopathy, lumbar region: Secondary | ICD-10-CM

## 2020-05-02 DIAGNOSIS — M6281 Muscle weakness (generalized): Secondary | ICD-10-CM | POA: Diagnosis not present

## 2020-05-02 DIAGNOSIS — R202 Paresthesia of skin: Secondary | ICD-10-CM | POA: Diagnosis not present

## 2020-05-02 DIAGNOSIS — Z1231 Encounter for screening mammogram for malignant neoplasm of breast: Secondary | ICD-10-CM

## 2020-05-04 DIAGNOSIS — M6281 Muscle weakness (generalized): Secondary | ICD-10-CM | POA: Diagnosis not present

## 2020-05-04 DIAGNOSIS — R202 Paresthesia of skin: Secondary | ICD-10-CM | POA: Diagnosis not present

## 2020-05-04 DIAGNOSIS — S39012A Strain of muscle, fascia and tendon of lower back, initial encounter: Secondary | ICD-10-CM | POA: Diagnosis not present

## 2020-05-04 DIAGNOSIS — R200 Anesthesia of skin: Secondary | ICD-10-CM | POA: Diagnosis not present

## 2020-05-09 DIAGNOSIS — M47816 Spondylosis without myelopathy or radiculopathy, lumbar region: Secondary | ICD-10-CM | POA: Diagnosis not present

## 2020-05-10 DIAGNOSIS — R200 Anesthesia of skin: Secondary | ICD-10-CM | POA: Diagnosis not present

## 2020-05-10 DIAGNOSIS — R202 Paresthesia of skin: Secondary | ICD-10-CM | POA: Diagnosis not present

## 2020-05-10 DIAGNOSIS — S39012A Strain of muscle, fascia and tendon of lower back, initial encounter: Secondary | ICD-10-CM | POA: Diagnosis not present

## 2020-05-10 DIAGNOSIS — M6281 Muscle weakness (generalized): Secondary | ICD-10-CM | POA: Diagnosis not present

## 2020-05-12 DIAGNOSIS — S39012A Strain of muscle, fascia and tendon of lower back, initial encounter: Secondary | ICD-10-CM | POA: Diagnosis not present

## 2020-05-12 DIAGNOSIS — R202 Paresthesia of skin: Secondary | ICD-10-CM | POA: Diagnosis not present

## 2020-05-12 DIAGNOSIS — R200 Anesthesia of skin: Secondary | ICD-10-CM | POA: Diagnosis not present

## 2020-05-12 DIAGNOSIS — M6281 Muscle weakness (generalized): Secondary | ICD-10-CM | POA: Diagnosis not present

## 2020-05-25 DIAGNOSIS — M47816 Spondylosis without myelopathy or radiculopathy, lumbar region: Secondary | ICD-10-CM | POA: Diagnosis not present

## 2020-06-15 ENCOUNTER — Other Ambulatory Visit: Payer: Self-pay | Admitting: Sports Medicine

## 2020-06-15 DIAGNOSIS — K580 Irritable bowel syndrome with diarrhea: Secondary | ICD-10-CM

## 2020-06-20 DIAGNOSIS — M47816 Spondylosis without myelopathy or radiculopathy, lumbar region: Secondary | ICD-10-CM | POA: Diagnosis not present

## 2020-06-21 ENCOUNTER — Ambulatory Visit
Admission: RE | Admit: 2020-06-21 | Discharge: 2020-06-21 | Disposition: A | Discharge status: Home / Self Care | Payer: Medicare PPO | Source: Ambulatory Visit | Attending: Sports Medicine | Admitting: Sports Medicine

## 2020-06-21 ENCOUNTER — Other Ambulatory Visit: Payer: Self-pay

## 2020-06-21 DIAGNOSIS — Z1231 Encounter for screening mammogram for malignant neoplasm of breast: Secondary | ICD-10-CM

## 2020-07-15 ENCOUNTER — Other Ambulatory Visit: Payer: Self-pay | Admitting: Sports Medicine

## 2020-07-15 DIAGNOSIS — M5416 Radiculopathy, lumbar region: Secondary | ICD-10-CM

## 2020-07-21 DIAGNOSIS — M5441 Lumbago with sciatica, right side: Secondary | ICD-10-CM | POA: Diagnosis not present

## 2020-07-26 ENCOUNTER — Ambulatory Visit: Payer: Medicare PPO | Admitting: Sports Medicine

## 2020-07-26 ENCOUNTER — Other Ambulatory Visit: Payer: Self-pay

## 2020-07-26 DIAGNOSIS — M5136 Other intervertebral disc degeneration, lumbar region: Secondary | ICD-10-CM

## 2020-07-26 DIAGNOSIS — M51369 Other intervertebral disc degeneration, lumbar region without mention of lumbar back pain or lower extremity pain: Secondary | ICD-10-CM

## 2020-07-26 NOTE — Progress Notes (Signed)
    Procedures performed today:    None.  Independent interpretation of notes and tests performed by another provider:   None.  Brief History, Exam, Impression, and Recommendations:    Lumbar degenerative disc disease This is a pleasant 70 year old female, I saw her back in April with left lumbar radiculitis, she typically gets her injections done by Dr. Regino Schultze at [facility] orthopedics, it sounds like he did a facet injection, and potentially an epidural as well and she has responded well. Unfortunately she has started to have pain in the right side of her low back with radiation down the right leg to the calf, she saw Dr. Luiz Blare and Gus Puma, PA-C and she was placed on a Medrol Dosepak which has resolved her symptoms. She is looking for guidance for when this comes back, she is currently taking gabapentin. I would certainly recommend a right L5-S1 transforaminal epidural, of note she does have transitional anatomy. The right L5-S1 disc appears to come close to the L5 nerve root.    ___________________________________________ Ihor Austin. Benjamin Stain, M.D., ABFM., CAQSM. Primary Care and Sports Medicine [place] [facility]  Adjunct Instructor of Family Medicine  [employer]

## 2020-07-26 NOTE — Assessment & Plan Note (Signed)
This is a pleasant 69 year old female, I saw her back in April with left lumbar radiculitis, she typically gets her injections done by Dr. Regino Schultze at [facility] orthopedics, it sounds like he did a facet injection, and potentially an epidural as well and she has responded well. Unfortunately she has started to have pain in the right side of her low back with radiation down the right leg to the calf, she saw Dr. Luiz Blare and Gus Puma, PA-C and she was placed on a Medrol Dosepak which has resolved her symptoms. She is looking for guidance for when this comes back, she is currently taking gabapentin. I would certainly recommend a right L5-S1 transforaminal epidural, of note she does have transitional anatomy. The right L5-S1 disc appears to come close to the L5 nerve root.

## 2020-08-10 ENCOUNTER — Other Ambulatory Visit: Payer: Self-pay | Admitting: Sports Medicine

## 2020-08-10 DIAGNOSIS — M5416 Radiculopathy, lumbar region: Secondary | ICD-10-CM

## 2020-08-10 DIAGNOSIS — M5412 Radiculopathy, cervical region: Secondary | ICD-10-CM

## 2020-08-10 MED ORDER — GABAPENTIN 600 MG PO TABS: 600.0000 mg | ORAL_TABLET | Freq: Every day | ORAL | 3 refills | Status: DC

## 2020-08-25 DIAGNOSIS — Z6826 Body mass index (BMI) 26.0-26.9, adult: Secondary | ICD-10-CM | POA: Diagnosis not present

## 2020-08-25 DIAGNOSIS — R635 Abnormal weight gain: Secondary | ICD-10-CM | POA: Diagnosis not present

## 2020-08-25 DIAGNOSIS — Z01419 Encounter for gynecological examination (general) (routine) without abnormal findings: Secondary | ICD-10-CM | POA: Diagnosis not present

## 2020-09-20 DIAGNOSIS — M47816 Spondylosis without myelopathy or radiculopathy, lumbar region: Secondary | ICD-10-CM | POA: Diagnosis not present

## 2020-09-20 DIAGNOSIS — M25511 Pain in right shoulder: Secondary | ICD-10-CM | POA: Diagnosis not present

## 2020-10-04 MED ORDER — HYOSCYAMINE SULFATE 0.125 MG PO TABS: 0.1250 mg | ORAL_TABLET | ORAL | 3 refills | Status: DC | PRN

## 2020-10-19 ENCOUNTER — Ambulatory Visit: Payer: Medicare PPO | Admitting: Sports Medicine

## 2020-10-19 ENCOUNTER — Other Ambulatory Visit: Payer: Self-pay

## 2020-10-19 DIAGNOSIS — M5416 Radiculopathy, lumbar region: Secondary | ICD-10-CM

## 2020-10-19 DIAGNOSIS — M47816 Spondylosis without myelopathy or radiculopathy, lumbar region: Secondary | ICD-10-CM | POA: Diagnosis not present

## 2020-10-19 MED ORDER — TRAMADOL HCL 50 MG PO TABS: 50.0000 mg | ORAL_TABLET | Freq: Three times a day (TID) | ORAL | 0 refills | Status: AC | PRN

## 2020-10-19 NOTE — Assessment & Plan Note (Signed)
Kathleen Garcia returns, she is a pleasant 70 year old female, she has had bilateral low back pain, she saw Dr. Regino Schultze at [facility] orthopedics and sound like she had a facet injection and did really well, had pain in the right side of the low back as well, had another injection with Dr. Regino Schultze, however I do not have records and I am unclear as to whether this was an epidural or a facet joint injection. 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 I would like to get records from Dr. Regino Schultze, if she had a epidural on the right I would like her to proceed with a facet joint injection. If she had a facet joint injection then maybe we should try an epidural. Refilling tramadol in the meantime.

## 2020-10-19 NOTE — Progress Notes (Signed)
    Procedures performed today:    None.  Independent interpretation of notes and tests performed by another provider:   MRI personally reviewed, dominant finding is L5-S1 facet arthritis, right worse than left with loss of joint space, as well as facet cyst.  Brief History, Exam, Impression, and Recommendations:    Lumbar spondylosis Navaeh returns, she is a pleasant 70 year old female, she has had bilateral low back pain, she saw Dr. Regino Schultze at [facility] orthopedics and sound like she had a facet injection and did really well, had pain in the right side of the low back as well, had another injection with Dr. Regino Schultze, however I do not have records and I am unclear as to whether this was an epidural or a facet joint injection. 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 I would like to get records from Dr. Regino Schultze, if she had a epidural on the right I would like her to proceed with a facet joint injection. If she had a facet joint injection then maybe we should try an epidural. Refilling tramadol in the meantime.    ___________________________________________ Ihor Austin. Benjamin Stain, M.D., ABFM., CAQSM. Primary Care and Sports Medicine [place] [facility]  Adjunct Instructor of Family Medicine  [employer]

## 2020-10-31 ENCOUNTER — Other Ambulatory Visit: Payer: Self-pay | Admitting: Sports Medicine

## 2020-10-31 DIAGNOSIS — R002 Palpitations: Secondary | ICD-10-CM

## 2020-11-14 ENCOUNTER — Ambulatory Visit: Payer: Medicare PPO | Admitting: Sports Medicine

## 2020-11-16 ENCOUNTER — Ambulatory Visit: Payer: Medicare PPO | Admitting: Sports Medicine

## 2020-11-18 DIAGNOSIS — M533 Sacrococcygeal disorders, not elsewhere classified: Secondary | ICD-10-CM | POA: Diagnosis not present

## 2020-11-18 DIAGNOSIS — M5412 Radiculopathy, cervical region: Secondary | ICD-10-CM | POA: Diagnosis not present

## 2020-11-23 ENCOUNTER — Encounter: Payer: Self-pay | Admitting: Sports Medicine

## 2020-11-23 MED ORDER — TRIAMCINOLONE ACETONIDE 0.5 % EX OINT: 1.0000 "application " | TOPICAL_OINTMENT | Freq: Two times a day (BID) | CUTANEOUS | 3 refills | Status: DC

## 2020-11-28 ENCOUNTER — Other Ambulatory Visit: Payer: Self-pay | Admitting: Physical Medicine and Rehabilitation

## 2020-11-28 DIAGNOSIS — M5412 Radiculopathy, cervical region: Secondary | ICD-10-CM

## 2020-11-29 ENCOUNTER — Encounter: Payer: Self-pay | Admitting: Sports Medicine

## 2020-12-03 ENCOUNTER — Ambulatory Visit (INDEPENDENT_AMBULATORY_CARE_PROVIDER_SITE_OTHER): Payer: Medicare PPO

## 2020-12-03 ENCOUNTER — Other Ambulatory Visit: Payer: Self-pay

## 2020-12-03 DIAGNOSIS — M5412 Radiculopathy, cervical region: Secondary | ICD-10-CM

## 2020-12-03 DIAGNOSIS — M25519 Pain in unspecified shoulder: Secondary | ICD-10-CM

## 2020-12-03 DIAGNOSIS — M2578 Osteophyte, vertebrae: Secondary | ICD-10-CM | POA: Diagnosis not present

## 2020-12-06 NOTE — Telephone Encounter (Signed)
Called and spoke with pt and told her that as of today, her insurance company is covering DEXILANT 60 MG capsule but effective 01/01/2021 they will not. I told her that a PA would need to be filed after the first of the year for her and her husband. She said that she would make a note on her calendar to send a new portal message after the first of the year requesting the PA. No further questions or concerns at this time.

## 2020-12-08 DIAGNOSIS — M533 Sacrococcygeal disorders, not elsewhere classified: Secondary | ICD-10-CM | POA: Diagnosis not present

## 2020-12-10 ENCOUNTER — Encounter: Payer: Self-pay | Admitting: Sports Medicine

## 2020-12-12 MED ORDER — GABAPENTIN 300 MG/6ML PO SOLN: 600.0000 mg | Freq: Every day | ORAL | 3 refills | Status: DC

## 2020-12-20 DIAGNOSIS — M533 Sacrococcygeal disorders, not elsewhere classified: Secondary | ICD-10-CM | POA: Diagnosis not present

## 2020-12-20 DIAGNOSIS — M47812 Spondylosis without myelopathy or radiculopathy, cervical region: Secondary | ICD-10-CM | POA: Diagnosis not present

## 2021-01-17 DIAGNOSIS — M47812 Spondylosis without myelopathy or radiculopathy, cervical region: Secondary | ICD-10-CM | POA: Diagnosis not present

## 2021-01-25 ENCOUNTER — Encounter: Payer: Self-pay | Admitting: Sports Medicine

## 2021-01-27 DIAGNOSIS — H5203 Hypermetropia, bilateral: Secondary | ICD-10-CM | POA: Diagnosis not present

## 2021-01-27 DIAGNOSIS — H04123 Dry eye syndrome of bilateral lacrimal glands: Secondary | ICD-10-CM | POA: Diagnosis not present

## 2021-01-27 DIAGNOSIS — H2513 Age-related nuclear cataract, bilateral: Secondary | ICD-10-CM | POA: Diagnosis not present

## 2021-01-30 ENCOUNTER — Encounter: Payer: Self-pay | Admitting: Sports Medicine

## 2021-02-01 ENCOUNTER — Encounter: Payer: Self-pay | Admitting: Sports Medicine

## 2021-02-01 ENCOUNTER — Telehealth: Payer: Medicare PPO | Admitting: Sports Medicine

## 2021-02-01 DIAGNOSIS — J40 Bronchitis, not specified as acute or chronic: Secondary | ICD-10-CM

## 2021-02-01 DIAGNOSIS — J329 Chronic sinusitis, unspecified: Secondary | ICD-10-CM | POA: Insufficient documentation

## 2021-02-01 MED ORDER — HYDROCOD POLI-CHLORPHE POLI ER 10-8 MG/5ML PO SUER: 5.0000 mL | Freq: Two times a day (BID) | ORAL | 0 refills | Status: DC | PRN

## 2021-02-01 MED ORDER — ONDANSETRON 8 MG PO TBDP: 8.0000 mg | ORAL_TABLET | Freq: Three times a day (TID) | ORAL | 3 refills | Status: DC | PRN

## 2021-02-01 MED ORDER — AZITHROMYCIN 200 MG/5ML PO SUSR: ORAL | 0 refills | Status: DC

## 2021-02-01 NOTE — Assessment & Plan Note (Signed)
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 No fevers, chills, muscle aches, body aches, no GI symptoms, skin rash, COVID-negative. She is requesting Tussionex, happy to do this, I am also going to add lower dose liquid azithromycin, she does get some nausea with azithromycin so we will add ondansetron ODT as well. Return to see [place] as needed.

## 2021-02-01 NOTE — Progress Notes (Signed)
° °  Virtual Visit via WebEx/MyChart   I connected with  Kathleen Garcia  on 02/01/21 via WebEx/MyChart/Doximity Video and verified that I am speaking with the correct person using two identifiers.   I discussed the limitations, risks, security and privacy concerns of performing an evaluation and management service by WebEx/MyChart/Doximity Video, including the higher likelihood of inaccurate diagnosis and treatment, and the availability of in person appointments.  We also discussed the likely need of an additional face to face encounter for complete and high quality delivery of care.  I also discussed with the patient that there may be a patient responsible charge related to this service. The patient expressed understanding and wishes to proceed.  Provider location is in medical facility. Patient location is at their home, different from provider location. People involved in care of the patient during this telehealth encounter were myself, my nurse/medical assistant, and my front office/scheduling team member.  Review of Systems: No fevers, chills, night sweats, weight loss, chest pain, or shortness of breath.   Objective Findings:    General: Speaking full sentences, no audible heavy breathing.  Sounds alert and appropriately interactive.  Appears well.  Face symmetric.  Extraocular movements intact.  Pupils equal and round.  No nasal flaring or accessory muscle use visualized.  Independent interpretation of tests performed by another provider:   None.  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 No fevers, chills, muscle aches, body aches, no GI symptoms, skin rash, COVID-negative. She is requesting Tussionex, happy to  do this, I am also going to add lower dose liquid azithromycin, she does get some nausea with azithromycin so we will add ondansetron ODT as well. Return to see [place] as needed.   I discussed the above assessment and treatment plan with the patient. The patient was provided an opportunity to ask questions and all were answered. The patient agreed with the plan and demonstrated an understanding of the instructions.   The patient was advised to call back or seek an in-person evaluation if the symptoms worsen or if the condition fails to improve as anticipated.   I provided 30 minutes of face to face and non-face-to-face time during this encounter date, time was needed to gather information, review chart, records, communicate/coordinate with staff remotely, as well as complete documentation.   ___________________________________________ Ihor Austin. Benjamin Stain, M.D., ABFM., CAQSM. Primary Care and Sports Medicine [facility]  Adjunct Instructor of Family Medicine  [employer]

## 2021-02-09 ENCOUNTER — Telehealth: Payer: Self-pay

## 2021-02-09 NOTE — Telephone Encounter (Signed)
Initiated Prior authorization [id number] 60 MG capsule  Via: Covermymeds Case/Key:[id number] Status: approved as of 02/09/21 Reason:Pt has high co-pay [employer] jane,medications are covered however 3 month supply of brand name dexilant was $100 Notified Pt via: Mychart

## 2021-02-13 DIAGNOSIS — M47812 Spondylosis without myelopathy or radiculopathy, cervical region: Secondary | ICD-10-CM | POA: Diagnosis not present

## 2021-02-19 ENCOUNTER — Other Ambulatory Visit: Payer: Self-pay | Admitting: Sports Medicine

## 2021-02-28 DIAGNOSIS — M5441 Lumbago with sciatica, right side: Secondary | ICD-10-CM | POA: Diagnosis not present

## 2021-02-28 DIAGNOSIS — M7061 Trochanteric bursitis, right hip: Secondary | ICD-10-CM | POA: Diagnosis not present

## 2021-02-28 DIAGNOSIS — M4722 Other spondylosis with radiculopathy, cervical region: Secondary | ICD-10-CM | POA: Diagnosis not present

## 2021-02-28 DIAGNOSIS — M25551 Pain in right hip: Secondary | ICD-10-CM | POA: Diagnosis not present

## 2021-03-11 ENCOUNTER — Encounter: Payer: Self-pay | Admitting: Sports Medicine

## 2021-03-11 DIAGNOSIS — R195 Other fecal abnormalities: Secondary | ICD-10-CM

## 2021-03-13 DIAGNOSIS — R195 Other fecal abnormalities: Secondary | ICD-10-CM | POA: Insufficient documentation

## 2021-03-13 MED ORDER — KETOCONAZOLE 200 MG PO TABS: 200.0000 mg | ORAL_TABLET | Freq: Every day | ORAL | 0 refills | Status: DC

## 2021-03-13 NOTE — Assessment & Plan Note (Signed)
Patient with cramping and diarrhea, tells me historically she has had yeast overgrowth isolated, requesting Nizoral. ?I am happy to oblige. ?

## 2021-03-20 ENCOUNTER — Telehealth: Payer: Medicare PPO | Admitting: Sports Medicine

## 2021-03-20 DIAGNOSIS — R195 Other fecal abnormalities: Secondary | ICD-10-CM

## 2021-03-20 NOTE — Progress Notes (Signed)
? ?  Virtual Visit via WebEx/MyChart ?  ?I connected with  Kathleen Garcia  on 03/20/21 via WebEx/MyChart/Doximity Video and verified that I am speaking with the correct person using two identifiers. ?  ?I discussed the limitations, risks, security and privacy concerns of performing an evaluation and management service by WebEx/MyChart/Doximity Video, including the higher likelihood of inaccurate diagnosis and treatment, and the availability of in person appointments.  We also discussed the likely need of an additional face to face encounter for complete and high quality delivery of care.  I also discussed with the patient that there may be a patient responsible charge related to this service. The patient expressed understanding and wishes to proceed. ? ?Provider location is in medical facility. ?Patient location is at their home, different from provider location. ?People involved in care of the patient during this telehealth encounter were myself, my nurse/medical assistant, and my front office/scheduling team member. ? ?Review of Systems: No fevers, chills, night sweats, weight loss, chest pain, or shortness of breath.  ? ?Objective Findings:   ? ?General: Speaking full sentences, no audible heavy breathing.  Sounds alert and appropriately interactive.  Appears well.  Face symmetric.  Extraocular movements intact.  Pupils equal and round.  No nasal flaring or accessory muscle use visualized. ? ?Independent interpretation of tests performed by another provider:  ? ?None. ? ?Brief History, Exam, Impression, and Recommendations:   ? ?Intestinal yeast overgrowth syndrome ?This is a pleasant 71 year old female, she has a patient history of yeast overgrowth syndrome, it sounds like she had a provider test her stool for yeast and it was positive, this does not always mean much, but her symptoms are similar recently after a course of antibiotics.  Per her request we added a course of antifungals, we need to do this for 3  solid weeks. ?She does note some improvement after the first few days, if insufficient relief after the full 3 weeks we will further investigate likely with labs, CT for more common processes such as peptic ulcer disease, H. pylori infection, diverticulitis. ? ? ?I discussed the above assessment and treatment plan with the patient. The patient was provided an opportunity to ask questions and all were answered. The patient agreed with the plan and demonstrated an understanding of the instructions. ?  ?The patient was advised to call back or seek an in-person evaluation if the symptoms worsen or if the condition fails to improve as anticipated. ?  ?I provided 30 minutes of face to face and non-face-to-face time during this encounter date, time was needed to gather information, review chart, records, communicate/coordinate with staff remotely, as well as complete documentation. ? ? ?___________________________________________ ?Ihor Austin. Benjamin Stain, M.D., ABFM., CAQSM. ?Primary Care and Sports Medicine ?[name] ? ?Adjunct Instructor of Family Medicine  ?[employer] ?

## 2021-03-20 NOTE — Progress Notes (Signed)
Gave zithromycin in February for sort throat. Has been on ketoconazole. She is having some stomach issue even while taking the meds.  ?

## 2021-03-20 NOTE — Assessment & Plan Note (Signed)
This is a pleasant 71 year old female, she has a patient history of yeast overgrowth syndrome, it sounds like she had a provider test her stool for yeast and it was positive, this does not always mean much, but her symptoms are similar recently after a course of antibiotics.  Per her request we added a course of antifungals, we need to do this for 3 solid weeks. ?She does note some improvement after the first few days, if insufficient relief after the full 3 weeks we will further investigate likely with labs, CT for more common processes such as peptic ulcer disease, H. pylori infection, diverticulitis. ? ?

## 2021-03-29 DIAGNOSIS — L814 Other melanin hyperpigmentation: Secondary | ICD-10-CM | POA: Diagnosis not present

## 2021-03-29 DIAGNOSIS — D1801 Hemangioma of skin and subcutaneous tissue: Secondary | ICD-10-CM | POA: Diagnosis not present

## 2021-03-29 DIAGNOSIS — L570 Actinic keratosis: Secondary | ICD-10-CM | POA: Diagnosis not present

## 2021-03-29 DIAGNOSIS — D2262 Melanocytic nevi of left upper limb, including shoulder: Secondary | ICD-10-CM | POA: Diagnosis not present

## 2021-03-29 DIAGNOSIS — L821 Other seborrheic keratosis: Secondary | ICD-10-CM | POA: Diagnosis not present

## 2021-04-05 ENCOUNTER — Encounter: Payer: Self-pay | Admitting: Sports Medicine

## 2021-04-06 MED ORDER — DEXLANSOPRAZOLE 60 MG PO CPDR: 60.0000 mg | DELAYED_RELEASE_CAPSULE | Freq: Every day | ORAL | 1 refills | Status: DC

## 2021-04-11 DIAGNOSIS — M25551 Pain in right hip: Secondary | ICD-10-CM | POA: Diagnosis not present

## 2021-04-11 DIAGNOSIS — M5441 Lumbago with sciatica, right side: Secondary | ICD-10-CM | POA: Diagnosis not present

## 2021-04-14 ENCOUNTER — Ambulatory Visit (INDEPENDENT_AMBULATORY_CARE_PROVIDER_SITE_OTHER): Payer: Medicare PPO

## 2021-04-14 ENCOUNTER — Ambulatory Visit: Payer: Medicare PPO | Admitting: Sports Medicine

## 2021-04-14 DIAGNOSIS — R1010 Upper abdominal pain, unspecified: Secondary | ICD-10-CM

## 2021-04-14 DIAGNOSIS — R1013 Epigastric pain: Secondary | ICD-10-CM

## 2021-04-14 DIAGNOSIS — K219 Gastro-esophageal reflux disease without esophagitis: Secondary | ICD-10-CM | POA: Diagnosis not present

## 2021-04-14 MED ORDER — SUCRALFATE 1 G PO TABS: 1.0000 g | ORAL_TABLET | Freq: Four times a day (QID) | ORAL | 0 refills | Status: DC

## 2021-04-14 NOTE — Progress Notes (Signed)
? ? ?  Procedures performed today:   ? ?None. ? ?Independent interpretation of notes and tests performed by another provider:  ? ?None. ? ?Brief History, Exam, Impression, and Recommendations:   ? ?Midepigastric pain ?Pleasant 72 year old female, [place] history of mid epigastric pain and GERD, historically well controlled with Dexilant, we had a few virtual encounters, MyChart messages where we really did not get much of a diagnosis as is typical, she is here in person, principal symptoms midepigastric pain, occasional belching, pressure sensation in the chest but not exertional. ?No melena, hematemesis, hematochezia. ?No change in diet. ?This all sounds like peptic ulcer disease, gastritis, or hiatal hernia. ?Continue Dexilant, adding Carafate, abdominal x-ray. ?We will do this for about 4 to 6 weeks before considering upper endoscopy. ?I would like to go and check some labs to, she is postcholecystectomy. ? ?Chronic process with exacerbation and pharmacologic intervention ? ?___________________________________________ ?Ihor Austin. Benjamin Stain, M.D., ABFM., CAQSM. ?Primary Care and Sports Medicine ?[name] ? ?Adjunct Instructor of Family Medicine  ?[employer] ?

## 2021-04-14 NOTE — Assessment & Plan Note (Addendum)
Pleasant 71 year old female, [place] history of mid epigastric pain and GERD, historically well controlled with Dexilant, we had a few virtual encounters, MyChart messages where we really did not get much of a diagnosis as is typical, she is here in person, principal symptoms midepigastric pain, occasional belching, pressure sensation in the chest but not exertional. ?No melena, hematemesis, hematochezia. ?No change in diet. ?This all sounds like peptic ulcer disease, gastritis, or hiatal hernia. ?Continue Dexilant, adding Carafate, abdominal x-ray. ?We will do this for about 4 to 6 weeks before considering upper endoscopy. ?I would like to go and check some labs to, she is postcholecystectomy. ?

## 2021-04-15 LAB — COMPREHENSIVE METABOLIC PANEL
AG Ratio: 1.5 (calc) (ref 1.0–2.5)
ALT: 14 U/L (ref 6–29)
AST: 21 U/L (ref 10–35)
Albumin: 4.3 g/dL (ref 3.6–5.1)
Alkaline phosphatase (APISO): 75 U/L (ref 37–153)
BUN: 19 mg/dL (ref 7–25)
CO2: 26 mmol/L (ref 20–32)
Calcium: 9.4 mg/dL (ref 8.6–10.4)
Chloride: 104 mmol/L (ref 98–110)
Creat: 0.87 mg/dL (ref 0.60–1.00)
Globulin: 2.8 g/dL (calc) (ref 1.9–3.7)
Glucose, Bld: 85 mg/dL (ref 65–99)
Potassium: 4.2 mmol/L (ref 3.5–5.3)
Sodium: 141 mmol/L (ref 135–146)
Total Bilirubin: 0.3 mg/dL (ref 0.2–1.2)
Total Protein: 7.1 g/dL (ref 6.1–8.1)

## 2021-04-15 LAB — CBC WITH DIFFERENTIAL/PLATELET
Absolute Monocytes: 826 cells/uL (ref 200–950)
Basophils Absolute: 61 cells/uL (ref 0–200)
Basophils Relative: 0.6 %
Eosinophils Absolute: 92 cells/uL (ref 15–500)
Eosinophils Relative: 0.9 %
HCT: 39.9 % (ref 35.0–45.0)
Hemoglobin: 13.5 g/dL (ref 11.7–15.5)
Lymphs Abs: 3631 cells/uL (ref 850–3900)
MCH: 30.2 pg (ref 27.0–33.0)
MCHC: 33.8 g/dL (ref 32.0–36.0)
MCV: 89.3 fL (ref 80.0–100.0)
MPV: 9.9 fL (ref 7.5–12.5)
Monocytes Relative: 8.1 %
Neutro Abs: 5590 cells/uL (ref 1500–7800)
Neutrophils Relative %: 54.8 %
Platelets: 332 10*3/uL (ref 140–400)
RBC: 4.47 10*6/uL (ref 3.80–5.10)
RDW: 12.5 % (ref 11.0–15.0)
Total Lymphocyte: 35.6 %
WBC: 10.2 10*3/uL (ref 3.8–10.8)

## 2021-04-15 LAB — AMYLASE: Amylase: 62 U/L (ref 21–101)

## 2021-04-15 LAB — LIPASE: Lipase: 48 U/L (ref 7–60)

## 2021-04-15 LAB — D-DIMER, QUANTITATIVE: D-Dimer, Quant: 0.49 mcg/mL FEU (ref <0.50)

## 2021-05-03 ENCOUNTER — Other Ambulatory Visit: Payer: Self-pay | Admitting: Sports Medicine

## 2021-05-03 DIAGNOSIS — R002 Palpitations: Secondary | ICD-10-CM

## 2021-05-12 ENCOUNTER — Encounter: Payer: Self-pay | Admitting: Sports Medicine

## 2021-05-12 ENCOUNTER — Ambulatory Visit: Payer: Medicare PPO | Admitting: Sports Medicine

## 2021-05-12 DIAGNOSIS — T753XXA Motion sickness, initial encounter: Secondary | ICD-10-CM | POA: Insufficient documentation

## 2021-05-12 DIAGNOSIS — R002 Palpitations: Secondary | ICD-10-CM

## 2021-05-12 DIAGNOSIS — R1013 Epigastric pain: Secondary | ICD-10-CM

## 2021-05-12 MED ORDER — SCOPOLAMINE 1 MG/3DAYS TD PT72: 1.0000 | MEDICATED_PATCH | TRANSDERMAL | 11 refills | Status: DC

## 2021-05-12 MED ORDER — NEBIVOLOL HCL 5 MG PO TABS: 5.0000 mg | ORAL_TABLET | Freq: Every day | ORAL | 3 refills | Status: DC

## 2021-05-12 NOTE — Assessment & Plan Note (Signed)
Chronic process, adding scopolamine patches. ?

## 2021-05-12 NOTE — Assessment & Plan Note (Signed)
Pleasant 71 year old female, she developed some abdominal bloating, dyspepsia type symptoms, initially suspected to be intestinal yeast overgrowth syndrome, we treated her with an antifungal per her request which did not really work, her symptoms did sound more like dyspepsia so we added Carafate to her Dexilant and she improved considerably, she can return to see me as needed, discontinue Carafate for now. ?Minimize alcohol, spicy foods, chocolate, tea, caffeine. ?No eating within 2 hours of bedtime. ?Return as needed. ?

## 2021-05-12 NOTE — Progress Notes (Signed)
? ? ?  Procedures performed today:   ? ?None. ? ?Independent interpretation of notes and tests performed by another provider:  ? ?None. ? ?Brief History, Exam, Impression, and Recommendations:   ? ?Midepigastric pain ?Pleasant 70 year old female, she developed some abdominal bloating, dyspepsia type symptoms, initially suspected to be intestinal yeast overgrowth syndrome, we treated her with an antifungal per her request which did not really work, her symptoms did sound more like dyspepsia so we added Carafate to her Dexilant and she improved considerably, she can return to see me as needed, discontinue Carafate for now. ?Minimize alcohol, spicy foods, chocolate, tea, caffeine. ?No eating within 2 hours of bedtime. ?Return as needed. ? ?Seasickness ?Chronic process, adding scopolamine patches. ? ?Palpitations ?Does really well with Bystolic, refilling. ? ?Chronic process with exacerbation and pharmacologic intervention ? ?___________________________________________ ?Ihor Austin. Benjamin Stain, M.D., ABFM., CAQSM. ?Primary Care and Sports Medicine ?[name] ? ?Adjunct Instructor of Family Medicine  ?[employer] ?

## 2021-05-12 NOTE — Assessment & Plan Note (Signed)
Does really well with Bystolic, refilling. ?

## 2021-05-23 DIAGNOSIS — M791 Myalgia, unspecified site: Secondary | ICD-10-CM | POA: Diagnosis not present

## 2021-05-23 DIAGNOSIS — M5451 Vertebrogenic low back pain: Secondary | ICD-10-CM | POA: Diagnosis not present

## 2021-05-30 ENCOUNTER — Other Ambulatory Visit: Payer: Self-pay | Admitting: Sports Medicine

## 2021-05-30 DIAGNOSIS — Z1231 Encounter for screening mammogram for malignant neoplasm of breast: Secondary | ICD-10-CM

## 2021-06-04 ENCOUNTER — Other Ambulatory Visit: Payer: Self-pay | Admitting: Sports Medicine

## 2021-06-15 ENCOUNTER — Other Ambulatory Visit: Payer: Self-pay | Admitting: Sports Medicine

## 2021-06-15 DIAGNOSIS — K219 Gastro-esophageal reflux disease without esophagitis: Secondary | ICD-10-CM

## 2021-06-19 ENCOUNTER — Encounter (INDEPENDENT_AMBULATORY_CARE_PROVIDER_SITE_OTHER): Payer: Medicare PPO | Admitting: Sports Medicine

## 2021-06-19 DIAGNOSIS — R1013 Epigastric pain: Secondary | ICD-10-CM | POA: Diagnosis not present

## 2021-06-20 MED ORDER — FAMOTIDINE 20 MG PO TABS: 20.0000 mg | ORAL_TABLET | Freq: Two times a day (BID) | ORAL | 3 refills | Status: DC

## 2021-06-20 NOTE — Addendum Note (Signed)
Addended by: Monica Becton on: 06/20/2021 05:01 PM   Modules accepted: Orders

## 2021-06-20 NOTE — Telephone Encounter (Signed)
I spent 5 total minutes of online digital evaluation and management services in this patient-initiated request for online care. 

## 2021-06-22 ENCOUNTER — Ambulatory Visit
Admission: RE | Admit: 2021-06-22 | Discharge: 2021-06-22 | Disposition: A | Discharge status: Home / Self Care | Payer: Medicare PPO | Source: Ambulatory Visit | Attending: Sports Medicine | Admitting: Sports Medicine

## 2021-06-22 DIAGNOSIS — Z1231 Encounter for screening mammogram for malignant neoplasm of breast: Secondary | ICD-10-CM | POA: Diagnosis not present

## 2021-07-18 DIAGNOSIS — K580 Irritable bowel syndrome with diarrhea: Secondary | ICD-10-CM | POA: Diagnosis not present

## 2021-07-18 DIAGNOSIS — R1013 Epigastric pain: Secondary | ICD-10-CM | POA: Diagnosis not present

## 2021-07-18 DIAGNOSIS — K219 Gastro-esophageal reflux disease without esophagitis: Secondary | ICD-10-CM | POA: Diagnosis not present

## 2021-07-19 ENCOUNTER — Other Ambulatory Visit: Payer: Self-pay | Admitting: Sports Medicine

## 2021-08-23 ENCOUNTER — Encounter: Payer: Self-pay | Admitting: General Practice

## 2021-09-01 ENCOUNTER — Other Ambulatory Visit: Payer: Self-pay | Admitting: Sports Medicine

## 2021-10-03 DIAGNOSIS — Z6826 Body mass index (BMI) 26.0-26.9, adult: Secondary | ICD-10-CM | POA: Diagnosis not present

## 2021-10-03 DIAGNOSIS — Z01419 Encounter for gynecological examination (general) (routine) without abnormal findings: Secondary | ICD-10-CM | POA: Diagnosis not present

## 2021-10-03 DIAGNOSIS — N390 Urinary tract infection, site not specified: Secondary | ICD-10-CM | POA: Diagnosis not present

## 2021-10-03 DIAGNOSIS — Z124 Encounter for screening for malignant neoplasm of cervix: Secondary | ICD-10-CM | POA: Diagnosis not present

## 2021-10-03 DIAGNOSIS — J329 Chronic sinusitis, unspecified: Secondary | ICD-10-CM | POA: Diagnosis not present

## 2021-10-03 DIAGNOSIS — R5383 Other fatigue: Secondary | ICD-10-CM | POA: Diagnosis not present

## 2021-10-17 ENCOUNTER — Encounter: Payer: Self-pay | Admitting: Sports Medicine

## 2021-10-17 ENCOUNTER — Ambulatory Visit (INDEPENDENT_AMBULATORY_CARE_PROVIDER_SITE_OTHER): Payer: Medicare PPO | Admitting: Sports Medicine

## 2021-10-17 VITALS — BP 110/65 | HR 80 | Ht 61.0 in | Wt 139.0 lb

## 2021-10-17 DIAGNOSIS — E782 Mixed hyperlipidemia: Secondary | ICD-10-CM | POA: Diagnosis not present

## 2021-10-17 DIAGNOSIS — J310 Chronic rhinitis: Secondary | ICD-10-CM

## 2021-10-17 DIAGNOSIS — Z0000 Encounter for general adult medical examination without abnormal findings: Secondary | ICD-10-CM

## 2021-10-17 MED ORDER — FEXOFENADINE-PSEUDOEPHED ER 180-240 MG PO TB24: 1.0000 | ORAL_TABLET | Freq: Every day | ORAL | Status: DC

## 2021-10-17 NOTE — Assessment & Plan Note (Signed)
Continue Singulair, continue Flonase, switching from Zyrtec to Allegra-D for the next month. If insufficient improvement we will image her sinuses and have ENT weigh in.

## 2021-10-17 NOTE — Assessment & Plan Note (Addendum)
Annual physical as above, declines Shingrix and flu, checking routine labs today. Up-to-date on colon cancer screening, she did have a colonoscopy recently with Dr. Paulita Fujita.

## 2021-10-17 NOTE — Progress Notes (Signed)
Subjective:    CC: Annual Physical Exam  HPI:  This patient is here for their annual physical  I reviewed the past medical history, family history, social history, surgical history, and allergies today and no changes were needed.  Please see the problem list section below in epic for further details.  Past Medical History: Past Medical History:  Diagnosis Date   Cervical spondylosis     with disk protrusion   Chest pain    Diarrhea    The history was provided by the patient   Family history of breast cancer    Family history of breast cancer gene mutation in first degree relative    Family history of leukemia    Family history of pancreatic cancer    Family history of prostate cancer    Fluttering heart    Irregular heart rate    Irritable bowel syndrome    Osteomyelitis (HCC)    middle phalanx, right index finger   Osteoporosis    Palpitation    Trigeminal neuralgia    Vomiting    The history was provided by the patient   Past Surgical History: Past Surgical History:  Procedure Laterality Date   CERVICAL DISC SURGERY     COLONOSCOPY     with biopsies   KNEE SURGERY     TONSILLECTOMY     TUBAL LIGATION     UPPER GASTROINTESTINAL ENDOSCOPY      with biopsies   Social History: Social History   Socioeconomic History   Marital status: Married    Spouse name: Not on file   Number of children: Not on file   Years of education: Not on file   Highest education level: Not on file  Occupational History   Not on file  Tobacco Use   Smoking status: Never   Smokeless tobacco: Never  Substance and Sexual Activity   Alcohol use: No   Drug use: No   Sexual activity: Never    Birth control/protection: Surgical  Other Topics Concern   Not on file  Social History Narrative   Not on file   Social Determinants of Health   Financial Resource Strain: Not on file  Food Insecurity: Not on file  Transportation Needs: Not on file  Physical Activity: Not on file   Stress: Not on file  Social Connections: Not on file   Family History: Family History  Problem Relation Age of Onset   Hypertension Mother    Coronary artery disease Father    Breast cancer Sister 51   Prostate cancer Brother 96   Pancreatic cancer Maternal Aunt 34   Breast cancer Cousin        dx 22s   Allergies: Allergies  Allergen Reactions   Morphine Anaphylaxis    Swelling    Cymbalta [Duloxetine Hcl] Nausea Only   Azithromycin Nausea And Vomiting   Betadine [Povidone Iodine]    Keflex [Cephalexin] Nausea And Vomiting   Lyrica [Pregabalin] Other (See Comments)    Headache   Povidone-Iodine    Promethazine Hcl    Xarelto [Rivaroxaban] Other (See Comments)    Severe nausea   Septra [Sulfamethoxazole-Trimethoprim] Rash   Medications: See med rec.  Review of Systems: No headache, visual changes, nausea, vomiting, diarrhea, constipation, dizziness, abdominal pain, skin rash, fevers, chills, night sweats, swollen lymph nodes, weight loss, chest pain, body aches, joint swelling, muscle aches, shortness of breath, mood changes, visual or auditory hallucinations.  Objective:    General: Well Developed, well nourished,  and in no acute distress.  Neuro: Alert and oriented x3, extra-ocular muscles intact, sensation grossly intact. Cranial nerves II through XII are intact, motor, sensory, and coordinative functions are all intact. HEENT: Normocephalic, atraumatic, pupils equal round reactive to light, neck supple, no masses, no lymphadenopathy, thyroid nonpalpable. Oropharynx, nasopharynx, external ear canals are unremarkable. Skin: Warm and dry, no rashes noted.  Cardiac: Regular rate and rhythm, no murmurs rubs or gallops.  Respiratory: Clear to auscultation bilaterally. Not using accessory muscles, speaking in full sentences.  Abdominal: Soft, nontender, nondistended, positive bowel sounds, no masses, no organomegaly.  Musculoskeletal: Shoulder, elbow, wrist, hip, knee, ankle  stable, and with full range of motion.  Impression and Recommendations:    The patient was counselled, risk factors were discussed, anticipatory guidance given.  Annual physical exam Annual physical as above, declines Shingrix and flu, checking routine labs today. Up-to-date on colon cancer screening, she did have a colonoscopy recently with Dr. Dulce Sellar.  Chronic rhinitis Continue Singulair, continue Flonase, switching from Zyrtec to Allegra-D for the next month. If insufficient improvement we will image her sinuses and have ENT weigh in.   ____________________________________________ Ihor Austin. Benjamin Stain, M.D., ABFM., CAQSM., AME. Primary Care and Sports Medicine [facility]  Adjunct Professor of Family Medicine  [employer]  Restaurant manager, fast food

## 2021-10-18 LAB — COMPLETE METABOLIC PANEL WITH GFR
AG Ratio: 1.4 (calc) (ref 1.0–2.5)
ALT: 11 U/L (ref 6–29)
AST: 19 U/L (ref 10–35)
Albumin: 4.2 g/dL (ref 3.6–5.1)
Alkaline phosphatase (APISO): 82 U/L (ref 37–153)
BUN: 14 mg/dL (ref 7–25)
CO2: 27 mmol/L (ref 20–32)
Calcium: 9.3 mg/dL (ref 8.6–10.4)
Chloride: 105 mmol/L (ref 98–110)
Creat: 0.84 mg/dL (ref 0.60–1.00)
Globulin: 2.9 g/dL (calc) (ref 1.9–3.7)
Glucose, Bld: 86 mg/dL (ref 65–99)
Potassium: 4.4 mmol/L (ref 3.5–5.3)
Sodium: 142 mmol/L (ref 135–146)
Total Bilirubin: 0.3 mg/dL (ref 0.2–1.2)
Total Protein: 7.1 g/dL (ref 6.1–8.1)
eGFR: 75 mL/min/{1.73_m2} (ref >=60)

## 2021-10-18 LAB — CBC
HCT: 37.5 % (ref 35.0–45.0)
Hemoglobin: 12.6 g/dL (ref 11.7–15.5)
MCH: 29.9 pg (ref 27.0–33.0)
MCHC: 33.6 g/dL (ref 32.0–36.0)
MCV: 88.9 fL (ref 80.0–100.0)
MPV: 10.1 fL (ref 7.5–12.5)
Platelets: 302 10*3/uL (ref 140–400)
RBC: 4.22 10*6/uL (ref 3.80–5.10)
RDW: 12.7 % (ref 11.0–15.0)
WBC: 5.3 10*3/uL (ref 3.8–10.8)

## 2021-10-18 LAB — LIPID PANEL
Cholesterol: 174 mg/dL (ref <200)
HDL: 58 mg/dL (ref >=50)
LDL Cholesterol (Calc): 99 mg/dL (calc)
Non-HDL Cholesterol (Calc): 116 mg/dL (calc) (ref <130)
Total CHOL/HDL Ratio: 3 (calc) (ref <5.0)
Triglycerides: 79 mg/dL (ref <150)

## 2021-10-18 LAB — TSH: TSH: 1 mIU/L (ref 0.40–4.50)

## 2021-10-22 ENCOUNTER — Other Ambulatory Visit: Payer: Self-pay | Admitting: Sports Medicine

## 2021-10-22 DIAGNOSIS — K580 Irritable bowel syndrome with diarrhea: Secondary | ICD-10-CM

## 2021-10-24 ENCOUNTER — Encounter (INDEPENDENT_AMBULATORY_CARE_PROVIDER_SITE_OTHER): Payer: Medicare PPO | Admitting: Sports Medicine

## 2021-10-24 ENCOUNTER — Other Ambulatory Visit: Payer: Self-pay | Admitting: Sports Medicine

## 2021-10-24 DIAGNOSIS — J310 Chronic rhinitis: Secondary | ICD-10-CM

## 2021-10-24 DIAGNOSIS — K580 Irritable bowel syndrome with diarrhea: Secondary | ICD-10-CM

## 2021-10-25 ENCOUNTER — Encounter: Payer: Self-pay | Admitting: Sports Medicine

## 2021-10-25 DIAGNOSIS — K580 Irritable bowel syndrome with diarrhea: Secondary | ICD-10-CM

## 2021-10-25 MED ORDER — IPRATROPIUM BROMIDE 0.06 % NA SOLN: 2.0000 | Freq: Four times a day (QID) | NASAL | 11 refills | Status: AC

## 2021-10-25 MED ORDER — DICYCLOMINE HCL 20 MG PO TABS: 20.0000 mg | ORAL_TABLET | Freq: Three times a day (TID) | ORAL | 3 refills | Status: DC | PRN

## 2021-10-25 NOTE — Telephone Encounter (Signed)
I spent 5 total minutes of online digital evaluation and management services in this patient-initiated request for online care. 

## 2021-11-01 ENCOUNTER — Encounter: Payer: Self-pay | Admitting: Physician Assistant

## 2021-11-01 ENCOUNTER — Ambulatory Visit: Payer: Medicare PPO | Admitting: Physician Assistant

## 2021-11-01 VITALS — BP 138/71 | HR 75 | Temp 98.5°F | Wt 139.0 lb

## 2021-11-01 DIAGNOSIS — J310 Chronic rhinitis: Secondary | ICD-10-CM

## 2021-11-01 DIAGNOSIS — J0140 Acute pansinusitis, unspecified: Secondary | ICD-10-CM

## 2021-11-01 DIAGNOSIS — J3489 Other specified disorders of nose and nasal sinuses: Secondary | ICD-10-CM

## 2021-11-01 MED ORDER — PREDNISONE 20 MG PO TABS: 20.0000 mg | ORAL_TABLET | Freq: Two times a day (BID) | ORAL | 0 refills | Status: DC

## 2021-11-01 NOTE — Progress Notes (Signed)
Established Patient Office Visit  Subjective   Patient ID: Kathleen Garcia, female    DOB: 1950-09-25  Age: 71 y.o. MRN: [id number]  No chief complaint on file.   HPI Pt is a 71 yo female who presents to the clinic to discuss ongoing sinus drainage down the back of her throat, metal taste in her mouth, coughing up sputum for the last 4 weeks. It used to be clear but has turned a little more yellow. She was told to try allegra D and has not helped and switched back to zyrtec. She was given ipratropium and that dried her nose up but did nothing for sinus drainage. She has no fever, chills, body aches, SOB, chest tightness. She has had some intermittent right sided sinus pressure. On October 3rd she was given a zpak. She has always been covid negative. She has a terrible time with antibiotics and does not want to take them unless she has too.   .. 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   PE (pulmonary embolism) 04/16/2013   Vasomotor instability 04/16/2013   Midepigastric pain 05/05/2013   Right shoulder pain 06/02/2013   Osteopenia 06/11/2013   Recurrent cystitis 01/29/2014   Generalized anxiety disorder 09/09/2015   Ganglion cyst of flexor tendon sheath of finger of right hand 04/06/2016   Left wrist pain 04/17/2016   Senile purpura ([place]) 09/11/2016   Plantar fasciitis, bilateral 10/23/2016   Retrocalcaneal bursitis of both feet 04/29/2017   Family history of breast cancer    Family history of prostate cancer    Family history of pancreatic cancer    Family history of leukemia    Family history of breast cancer gene mutation in first degree relative    Genetic testing 01/22/2018   Irritable bowel syndrome with diarrhea 05/21/2018    Nocturnal leg cramps 09/17/2018   Need for MMR vaccine 02/24/2019   Numbness and tingling of right leg 12/10/2019   Chronic rhinitis 10/17/2021   Acute non-recurrent pansinusitis 11/03/2021   Resolved Ambulatory Problems    Diagnosis Date Noted   URI 03/20/2008   Pain in right foot 03/19/2012   Viral gastroenteritis 02/10/2013   Coughing 12/03/2013   Intertrigo 04/06/2016   Postnasal drip 11/30/2016   Abnormal weight gain 10/24/2018   Sinobronchitis 02/01/2021   Intestinal yeast overgrowth syndrome 03/13/2021   Seasickness 05/12/2021   Past Medical History:  Diagnosis Date   Cervical spondylosis    Chest pain    Diarrhea    Fluttering heart    Irregular heart rate    Irritable bowel syndrome    Osteomyelitis (HCC)    Osteoporosis    Palpitation    Trigeminal neuralgia    Vomiting      ROS See HPI.    Objective:     BP 138/71   Pulse 75   Temp 98.5 F (36.9 C)   Wt 139 lb (63 kg)   SpO2 99%   BMI 26.26 kg/m  BP Readings from Last 3 Encounters:  11/01/21 138/71  10/17/21 110/65  05/12/21 108/65   Wt Readings from Last 3 Encounters:  11/01/21 139 lb (63 kg)  10/17/21 139 lb (63 kg)  04/14/21 134 lb 1.9 oz (60.8 kg)      Physical Exam  Constitutional:      Appearance: Normal appearance.  HENT:     Head: Normocephalic.     Right Ear: Tympanic membrane, ear canal and external ear normal. There is no impacted cerumen.     Left Ear: Tympanic membrane, ear canal and external ear normal. There is no impacted cerumen.     Nose: Congestion present.     Mouth/Throat:     Mouth: Mucous membranes are moist.     Pharynx: Posterior oropharyngeal erythema present. No oropharyngeal exudate.  Eyes:     Conjunctiva/sclera: Conjunctivae normal.  Cardiovascular:     Rate and Rhythm: Normal rate.  Pulmonary:     Effort: Pulmonary effort is normal.  Musculoskeletal:     Cervical back: Normal range of motion and neck supple. No tenderness.  Lymphadenopathy:      Cervical: No cervical adenopathy.  Neurological:     General: No focal deficit present.     Mental Status: She is alert and oriented to person, place, and time.  Psychiatric:        Mood and Affect: Mood normal.         Assessment & Plan:  Marland KitchenMarland KitchenDiagnoses and all orders for this visit:  Acute non-recurrent pansinusitis -     predniSONE (DELTASONE) 20 MG tablet; Take 1 tablet (20 mg total) by mouth 2 (two) times daily with a meal. For 5 days.  Chronic rhinitis -     predniSONE (DELTASONE) 20 MG tablet; Take 1 tablet (20 mg total) by mouth 2 (two) times daily with a meal. For 5 days.   No red flag signs or symptoms.  She has completed zpak but symptoms consistent with some sinusitis and drainage Start prednisone and see if decreasing inflammation helps Consider amoxil if sinus drainage worsens or new symptoms Failed change of allergy medication and adding new nasal spray Consider ENT referral if continues or CT of sinuses.    Iran Planas, PA-C

## 2021-11-03 ENCOUNTER — Encounter: Payer: Self-pay | Admitting: Physician Assistant

## 2021-11-03 DIAGNOSIS — J0140 Acute pansinusitis, unspecified: Secondary | ICD-10-CM | POA: Insufficient documentation

## 2021-11-03 DIAGNOSIS — J3489 Other specified disorders of nose and nasal sinuses: Secondary | ICD-10-CM | POA: Insufficient documentation

## 2021-11-06 ENCOUNTER — Encounter: Payer: Self-pay | Admitting: Physician Assistant

## 2021-11-07 MED ORDER — AMOXICILLIN 400 MG/5ML PO SUSR: 500.0000 mg | Freq: Three times a day (TID) | ORAL | 0 refills | Status: DC

## 2021-11-11 ENCOUNTER — Other Ambulatory Visit: Payer: Self-pay | Admitting: Sports Medicine

## 2021-11-11 DIAGNOSIS — M5412 Radiculopathy, cervical region: Secondary | ICD-10-CM

## 2021-11-13 ENCOUNTER — Encounter: Payer: Self-pay | Admitting: Sports Medicine

## 2021-11-14 ENCOUNTER — Other Ambulatory Visit: Payer: Self-pay | Admitting: Sports Medicine

## 2021-11-14 MED ORDER — GABAPENTIN 300 MG/6ML PO SOLN: 600.0000 mg | Freq: Every day | ORAL | 3 refills | Status: DC

## 2021-11-15 ENCOUNTER — Encounter: Payer: Self-pay | Admitting: Physician Assistant

## 2021-11-17 ENCOUNTER — Other Ambulatory Visit: Payer: Self-pay | Admitting: Sports Medicine

## 2021-11-17 ENCOUNTER — Other Ambulatory Visit: Payer: Self-pay | Admitting: Physician Assistant

## 2021-11-17 DIAGNOSIS — M5412 Radiculopathy, cervical region: Secondary | ICD-10-CM

## 2021-11-17 MED ORDER — GABAPENTIN 300 MG PO CAPS: ORAL_CAPSULE | ORAL | 1 refills | Status: DC

## 2021-11-17 NOTE — Progress Notes (Signed)
Pt called and wanted gabapentin capsules not solution. Capsules sent 2 at bedtime.

## 2021-12-14 ENCOUNTER — Other Ambulatory Visit: Payer: Self-pay | Admitting: Sports Medicine

## 2021-12-14 DIAGNOSIS — R1013 Epigastric pain: Secondary | ICD-10-CM

## 2021-12-18 ENCOUNTER — Other Ambulatory Visit: Payer: Self-pay | Admitting: Sports Medicine

## 2022-01-18 DIAGNOSIS — M4722 Other spondylosis with radiculopathy, cervical region: Secondary | ICD-10-CM | POA: Diagnosis not present

## 2022-01-18 DIAGNOSIS — M542 Cervicalgia: Secondary | ICD-10-CM | POA: Diagnosis not present

## 2022-01-21 ENCOUNTER — Other Ambulatory Visit: Payer: Self-pay | Admitting: Sports Medicine

## 2022-01-26 DIAGNOSIS — R6882 Decreased libido: Secondary | ICD-10-CM | POA: Diagnosis not present

## 2022-01-29 DIAGNOSIS — H2513 Age-related nuclear cataract, bilateral: Secondary | ICD-10-CM | POA: Diagnosis not present

## 2022-01-29 DIAGNOSIS — H524 Presbyopia: Secondary | ICD-10-CM | POA: Diagnosis not present

## 2022-02-09 DIAGNOSIS — R6882 Decreased libido: Secondary | ICD-10-CM | POA: Diagnosis not present

## 2022-02-27 DIAGNOSIS — M8588 Other specified disorders of bone density and structure, other site: Secondary | ICD-10-CM | POA: Diagnosis not present

## 2022-02-27 DIAGNOSIS — K219 Gastro-esophageal reflux disease without esophagitis: Secondary | ICD-10-CM | POA: Diagnosis not present

## 2022-02-27 DIAGNOSIS — N958 Other specified menopausal and perimenopausal disorders: Secondary | ICD-10-CM | POA: Diagnosis not present

## 2022-03-01 ENCOUNTER — Other Ambulatory Visit: Payer: Self-pay | Admitting: Sports Medicine

## 2022-03-01 DIAGNOSIS — R195 Other fecal abnormalities: Secondary | ICD-10-CM

## 2022-03-04 ENCOUNTER — Encounter: Payer: Self-pay | Admitting: Physician Assistant

## 2022-03-12 DIAGNOSIS — R300 Dysuria: Secondary | ICD-10-CM | POA: Diagnosis not present

## 2022-03-12 DIAGNOSIS — R319 Hematuria, unspecified: Secondary | ICD-10-CM | POA: Diagnosis not present

## 2022-03-12 DIAGNOSIS — R635 Abnormal weight gain: Secondary | ICD-10-CM | POA: Diagnosis not present

## 2022-03-12 DIAGNOSIS — K589 Irritable bowel syndrome without diarrhea: Secondary | ICD-10-CM | POA: Diagnosis not present

## 2022-03-12 DIAGNOSIS — R6882 Decreased libido: Secondary | ICD-10-CM | POA: Diagnosis not present

## 2022-03-13 ENCOUNTER — Telehealth: Payer: Self-pay | Admitting: Sports Medicine

## 2022-03-13 NOTE — Telephone Encounter (Signed)
Contacted [place] to schedule their annual wellness visit. Call back at later date: 10/16/2022  Kathleen Garcia Patient Access Advocate II Direct Dial: [phone]

## 2022-04-09 ENCOUNTER — Other Ambulatory Visit: Payer: Self-pay | Admitting: Sports Medicine

## 2022-04-09 DIAGNOSIS — R6882 Decreased libido: Secondary | ICD-10-CM | POA: Diagnosis not present

## 2022-04-11 ENCOUNTER — Other Ambulatory Visit: Payer: Self-pay | Admitting: Sports Medicine

## 2022-04-11 DIAGNOSIS — L821 Other seborrheic keratosis: Secondary | ICD-10-CM | POA: Diagnosis not present

## 2022-04-11 DIAGNOSIS — R002 Palpitations: Secondary | ICD-10-CM

## 2022-04-11 DIAGNOSIS — L814 Other melanin hyperpigmentation: Secondary | ICD-10-CM | POA: Diagnosis not present

## 2022-04-11 DIAGNOSIS — L570 Actinic keratosis: Secondary | ICD-10-CM | POA: Diagnosis not present

## 2022-04-11 DIAGNOSIS — L111 Transient acantholytic dermatosis [Grover]: Secondary | ICD-10-CM | POA: Diagnosis not present

## 2022-04-12 ENCOUNTER — Encounter: Payer: Self-pay | Admitting: Sports Medicine

## 2022-04-12 DIAGNOSIS — R002 Palpitations: Secondary | ICD-10-CM

## 2022-04-13 MED ORDER — NEBIVOLOL HCL 5 MG PO TABS
5.0000 mg | ORAL_TABLET | Freq: Every day | ORAL | 3 refills | Status: DC
Start: 2022-04-13 — End: 2023-01-14

## 2022-05-07 ENCOUNTER — Other Ambulatory Visit: Payer: Self-pay | Admitting: Sports Medicine

## 2022-05-09 ENCOUNTER — Other Ambulatory Visit: Payer: Self-pay | Admitting: Physician Assistant

## 2022-05-09 DIAGNOSIS — R891 Abnormal level of hormones in specimens from other organs, systems and tissues: Secondary | ICD-10-CM | POA: Diagnosis not present

## 2022-05-10 ENCOUNTER — Other Ambulatory Visit: Payer: Self-pay | Admitting: Sports Medicine

## 2022-05-10 DIAGNOSIS — Z1231 Encounter for screening mammogram for malignant neoplasm of breast: Secondary | ICD-10-CM

## 2022-05-11 ENCOUNTER — Encounter: Payer: Self-pay | Admitting: Sports Medicine

## 2022-05-14 DIAGNOSIS — R6882 Decreased libido: Secondary | ICD-10-CM | POA: Diagnosis not present

## 2022-05-14 IMAGING — MR MR CERVICAL SPINE W/O CM
5 series · 38 of 48 positions shown · non-contrast
Comparison: Cervical MRI 10/31/2017.  Radiographs 03/19/2012.

CLINICAL DATA: Radiculopathy, right side. Neck and shoulder pain
for 5 months. No known injury. History of remote cervical fusion.

EXAM:
MRI CERVICAL SPINE WITHOUT CONTRAST
TECHNIQUE: Multiplanar, multisequence MR imaging of the cervical spine was
performed. No intravenous contrast was administered.

[Series 2: T2 · sagittal · 3.0mm · 0.86mm/px · 7 of 15 slices shown (1 of 2)]
[im 1/15]
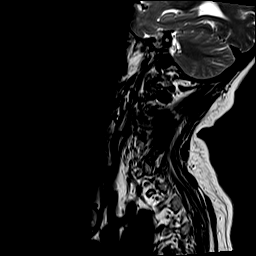
[im 3/15]
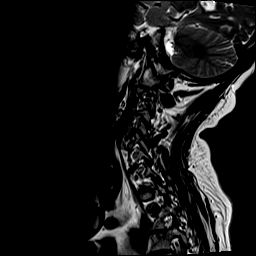
[im 5/15]
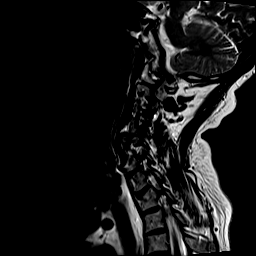
[im 8/15]
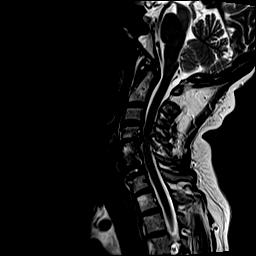
[im 10/15]
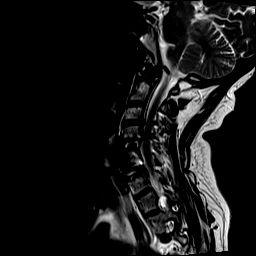
[im 12/15]
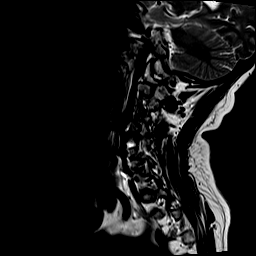
[im 15/15]
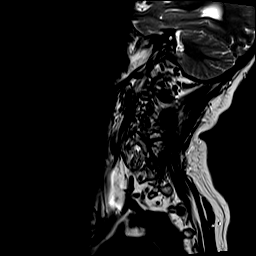

[Series 3: T1 · sagittal · 3.0mm · 0.86mm/px · 7 of 15 slices shown]
[im 1/15]
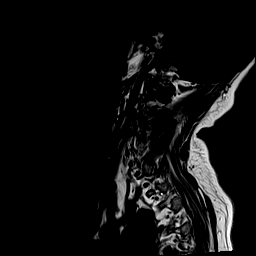
[im 3/15]
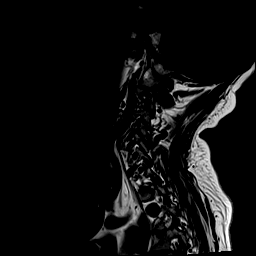
[im 5/15]
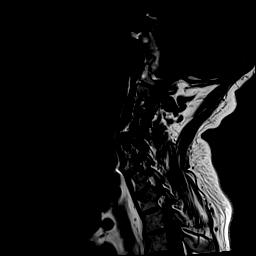
[im 8/15]
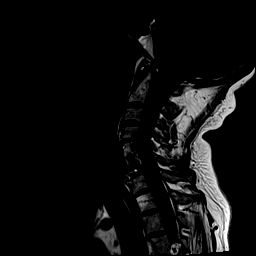
[im 10/15]
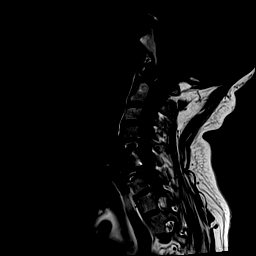
[im 12/15]
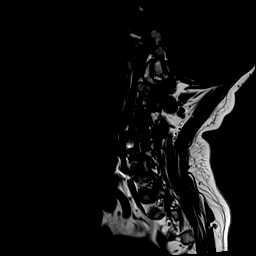
[im 15/15]
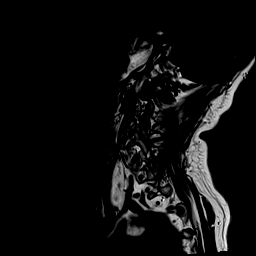

[Series 4: STIR · sagittal · 3.0mm · 0.86mm/px · 6 of 15 slices shown]
[im 1/15]
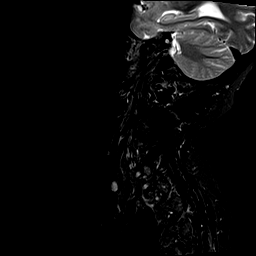
[im 3/15]
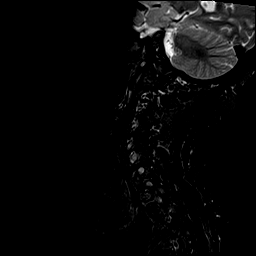
[im 6/15]
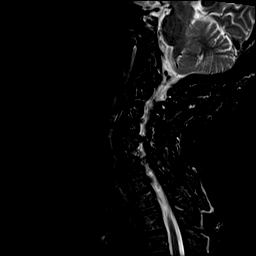
[im 9/15]
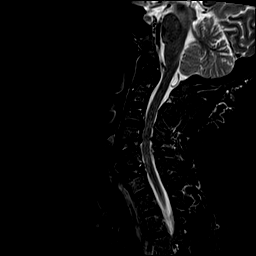
[im 12/15]
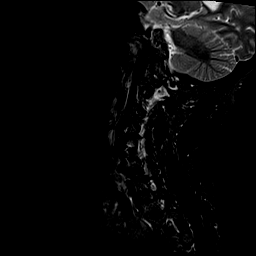
[im 15/15]
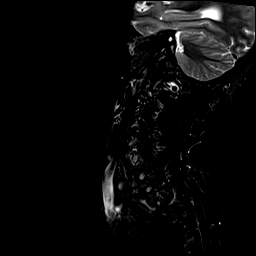

[Series 5: T2 · axial · 3.0mm · 0.56mm/px · z∈[-79,+24]mm · 10 of 34 slices shown (2 of 2)]
[im 1/34]
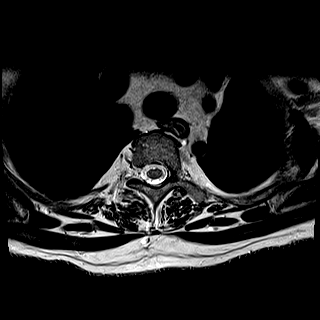
[im 3/34]
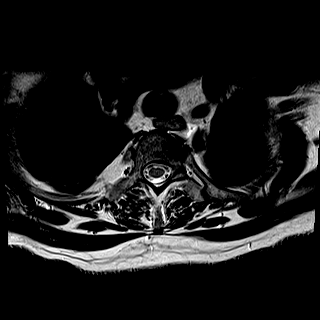
[im 6/34]
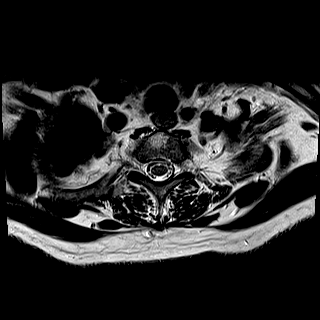
[im 8/34]
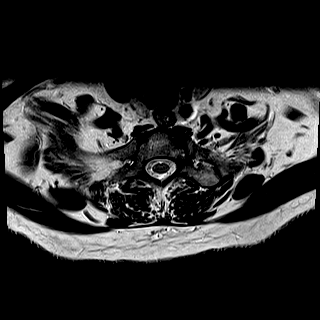
[im 11/34]
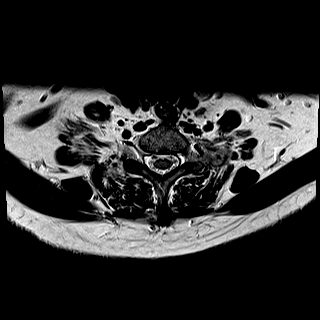
[im 16/34]
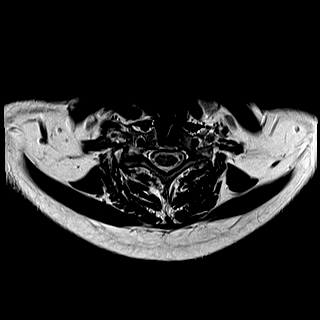
[im 18/34]
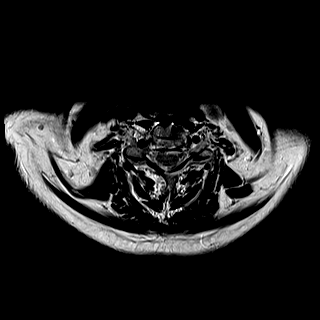
[im 23/34]
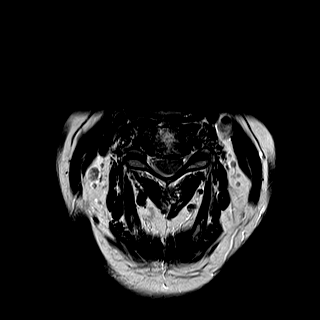
[im 28/34]
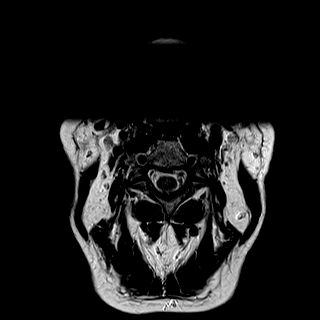
[im 34/34]
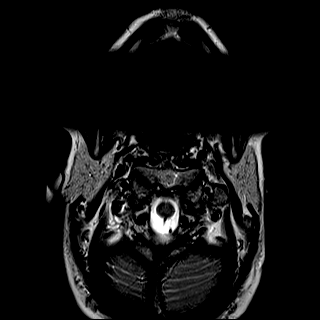

[Series 6: mpgr ax · axial · 3.0mm · 0.35mm/px · z∈[-79,+24]mm · 8 of 34 slices shown]
[im 1/34]
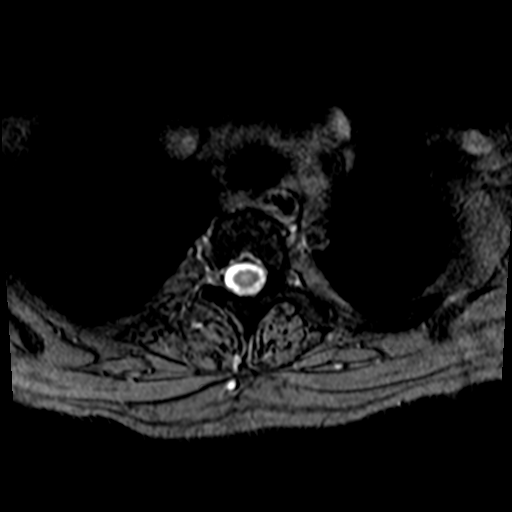
[im 6/34]
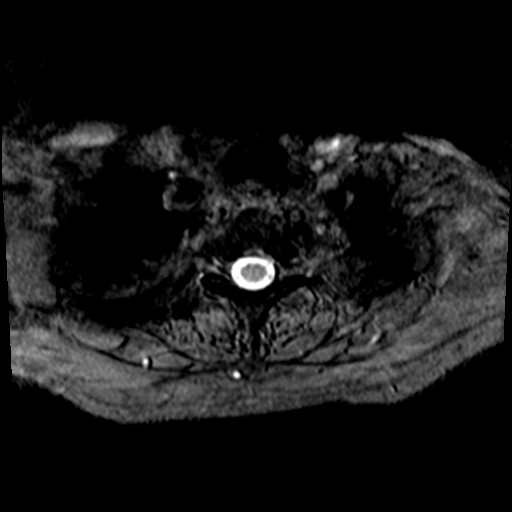
[im 11/34]
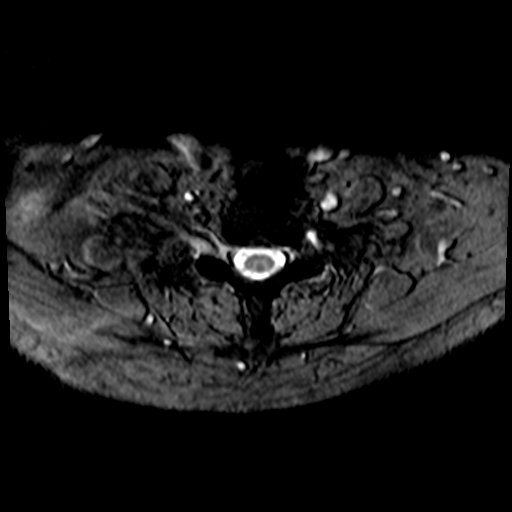
[im 16/34]
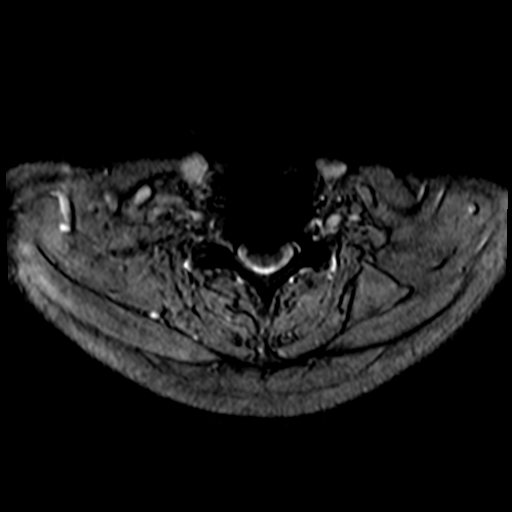
[im 18/34]
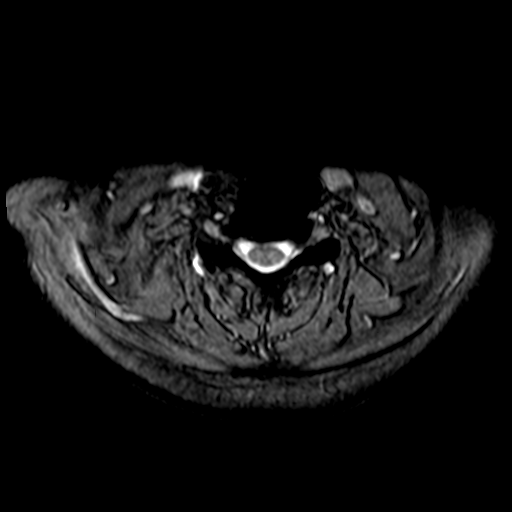
[im 23/34]
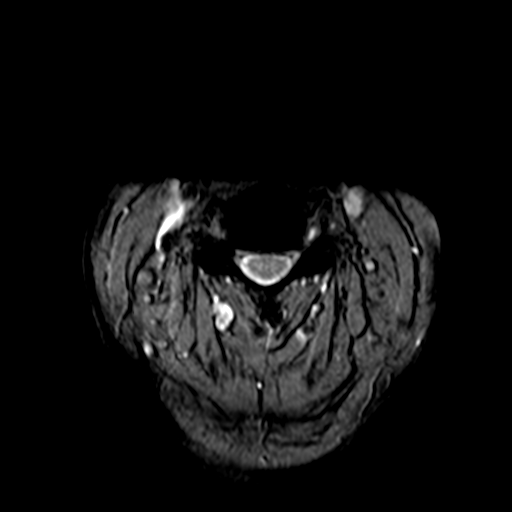
[im 28/34]
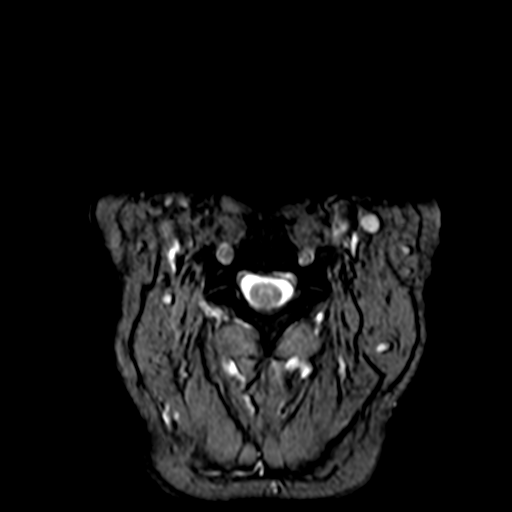
[im 34/34]
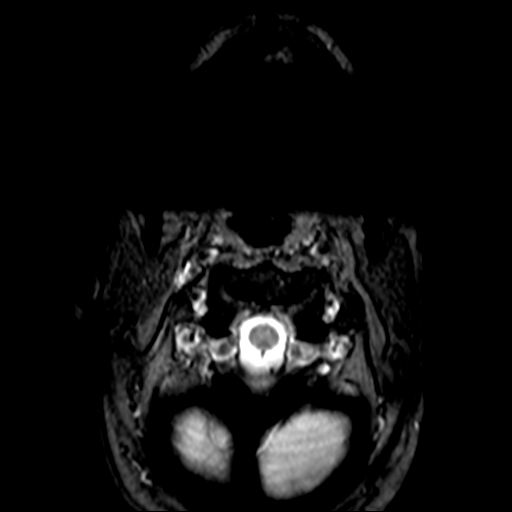

[38 of 48 positions shown; findings below may reference images not displayed]

FINDINGS: Alignment: Stable. There is a mild convex left scoliosis and a
minimal anterolisthesis at C6-7.

Vertebrae: No acute or suspicious osseous findings. Status post C5-6
ACDF with solid interbody fusion. Chronic endplate degenerative
changes at C[DATE] be mildly progressive. There is a hemangioma
within the T1 vertebral body.

Cord: Normal in signal and caliber.

Posterior Fossa, vertebral arteries, paraspinal tissues: Visualized
portions of the posterior fossa appear unremarkable. No significant
paraspinal findings. Bilateral vertebral artery flow voids.

Disc levels:

C2-3: No significant findings.

C3-4: New central disc protrusion effacing the CSF surrounding the
cord. No significant cord deformity. Stable mild disc bulging with
asymmetric uncinate spurring on the left contributing to mild left
foraminal narrowing.

C4-5: Chronic spondylosis with loss of disc height, annular disc
bulging and endplate osteophytes asymmetric to the right. The CSF
surrounding the cord is chronically effaced without significant cord
deformity. There is stable severe right and moderate to severe left
foraminal narrowing.

C5-6: Stable appearance status post anterior discectomy and fusion.
No significant foraminal stenosis.

C6-7: Loss of disc height with mild disc bulging and uncinate
spurring. No spinal stenosis or nerve root encroachment.

C7-T1: Normal interspace.
IMPRESSION: 1. New central disc protrusion at C3-4 contributing to mild spinal
stenosis, but no cord deformity or abnormal cord signal. Stable mild
left foraminal narrowing at this level.
2. Similar chronic adjacent segment disease at C4-5 with spondylosis
contributing to moderate spinal stenosis, severe right and moderate
to severe left foraminal narrowing.
3. Previous C5-6 ACDF without recurrent spinal stenosis.
4. Stable mild degenerative changes at C6-7 without spinal stenosis
or nerve root encroachment.

## 2022-06-26 ENCOUNTER — Encounter: Payer: Self-pay | Admitting: Sports Medicine

## 2022-06-27 ENCOUNTER — Ambulatory Visit
Admission: RE | Admit: 2022-06-27 | Discharge: 2022-06-27 | Disposition: A | Discharge status: Home / Self Care | Payer: Medicare PPO | Source: Ambulatory Visit | Attending: Sports Medicine | Admitting: Sports Medicine

## 2022-06-27 DIAGNOSIS — Z1231 Encounter for screening mammogram for malignant neoplasm of breast: Secondary | ICD-10-CM | POA: Diagnosis not present

## 2022-07-09 DIAGNOSIS — R6882 Decreased libido: Secondary | ICD-10-CM | POA: Diagnosis not present

## 2022-08-09 ENCOUNTER — Other Ambulatory Visit: Payer: Self-pay | Admitting: Sports Medicine

## 2022-08-15 ENCOUNTER — Other Ambulatory Visit: Payer: Self-pay | Admitting: Sports Medicine

## 2022-09-03 ENCOUNTER — Other Ambulatory Visit: Payer: Self-pay | Admitting: Sports Medicine

## 2022-09-03 DIAGNOSIS — J329 Chronic sinusitis, unspecified: Secondary | ICD-10-CM

## 2022-09-06 DIAGNOSIS — R6882 Decreased libido: Secondary | ICD-10-CM | POA: Diagnosis not present

## 2022-09-21 ENCOUNTER — Ambulatory Visit: Payer: Medicare PPO

## 2022-09-21 ENCOUNTER — Ambulatory Visit (INDEPENDENT_AMBULATORY_CARE_PROVIDER_SITE_OTHER): Payer: Medicare PPO | Admitting: Sports Medicine

## 2022-09-21 VITALS — BP 102/72 | HR 79 | Ht 61.0 in | Wt 141.0 lb

## 2022-09-21 DIAGNOSIS — R197 Diarrhea, unspecified: Secondary | ICD-10-CM

## 2022-09-21 DIAGNOSIS — R1031 Right lower quadrant pain: Secondary | ICD-10-CM | POA: Diagnosis not present

## 2022-09-21 DIAGNOSIS — R112 Nausea with vomiting, unspecified: Secondary | ICD-10-CM

## 2022-09-21 MED ORDER — DIPHENOXYLATE-ATROPINE 2.5-0.025 MG PO TABS
ORAL_TABLET | ORAL | 3 refills | Status: AC
Start: 2022-09-21 — End: ?

## 2022-09-21 MED ORDER — PROCHLORPERAZINE MALEATE 10 MG PO TABS: 10.0000 mg | ORAL_TABLET | Freq: Four times a day (QID) | ORAL | 3 refills | Status: DC | PRN

## 2022-09-21 NOTE — Assessment & Plan Note (Signed)
Pleasant 72 year old female, she is status post cholecystectomy in the distant past, she does have history of IBS, for the past 4 weeks since Labor Day she has had increasing nausea not controlled by Zofran, vomiting, diarrhea. No melena, hematochezia, hematemesis. She does have stearrhea. Feels low-grade temperatures. No oral intake out of the ordinary for her. No recent antibiotic use. On exam her belly is soft, nontender, nondistended, no rigidity, no guarding. Unclear etiology, we will do more of a shotgun workup to begin with, CBC with differential, CMP, amylase, lipase, C. difficile, stool cultures. Abdominal x-rays. Adding Compazine, Lomotil, clear liquid diet for now. She does have an appointment with gastroenterology/Dr. Dulce Sellar coming up in about 2 weeks. Of note she is still taking her Dexilant.

## 2022-09-21 NOTE — Progress Notes (Signed)
    Procedures performed today:    None.  Independent interpretation of notes and tests performed by another provider:   None.  Brief History, Exam, Impression, and Recommendations:    Nausea vomiting and diarrhea Pleasant 72 year old female, she is status post cholecystectomy in the distant past, she does have history of IBS, for the past 4 weeks since Labor Day she has had increasing nausea not controlled by Zofran, vomiting, diarrhea. No melena, hematochezia, hematemesis. She does have stearrhea. Feels low-grade temperatures. No oral intake out of the ordinary for her. No recent antibiotic use. On exam her belly is soft, nontender, nondistended, no rigidity, no guarding. Unclear etiology, we will do more of a shotgun workup to begin with, CBC with differential, CMP, amylase, lipase, C. difficile, stool cultures. Abdominal x-rays. Adding Compazine, Lomotil, clear liquid diet for now. She does have an appointment with gastroenterology/Dr. Dulce Sellar coming up in about 2 weeks. Of note she is still taking her Dexilant.    ____________________________________________ Ihor Austin. Benjamin Stain, M.D., ABFM., CAQSM., AME. Primary Care and Sports Medicine [facility]  Adjunct Professor of Family Medicine  [place] [employer] [facility] [employer]  Restaurant manager, fast food

## 2022-09-22 ENCOUNTER — Encounter: Payer: Self-pay | Admitting: Sports Medicine

## 2022-09-22 LAB — CBC WITH DIFFERENTIAL/PLATELET
Basophils Absolute: 0.1 10*3/uL (ref 0.0–0.2)
Basos: 1 %
EOS (ABSOLUTE): 0.2 10*3/uL (ref 0.0–0.4)
Eos: 2 %
Hematocrit: 41.5 % (ref 34.0–46.6)
Hemoglobin: 13.8 g/dL (ref 11.1–15.9)
Immature Grans (Abs): 0 10*3/uL (ref 0.0–0.1)
Immature Granulocytes: 0 %
Lymphocytes Absolute: 1.9 10*3/uL (ref 0.7–3.1)
Lymphs: 28 %
MCH: 30.1 pg (ref 26.6–33.0)
MCHC: 33.3 g/dL (ref 31.5–35.7)
MCV: 91 fL (ref 79–97)
Monocytes Absolute: 0.5 10*3/uL (ref 0.1–0.9)
Monocytes: 7 %
Neutrophils Absolute: 4.2 10*3/uL (ref 1.4–7.0)
Neutrophils: 62 %
Platelets: 289 10*3/uL (ref 150–450)
RBC: 4.58 x10E6/uL (ref 3.77–5.28)
RDW: 13.7 % (ref 11.7–15.4)
WBC: 6.8 10*3/uL (ref 3.4–10.8)

## 2022-09-22 LAB — COMPREHENSIVE METABOLIC PANEL
ALT: 8 IU/L (ref 0–32)
AST: 20 IU/L (ref 0–40)
Albumin: 4.3 g/dL (ref 3.8–4.8)
Alkaline Phosphatase: 90 IU/L (ref 44–121)
BUN/Creatinine Ratio: 7 — ABNORMAL LOW (ref 12–28)
BUN: 7 mg/dL — ABNORMAL LOW (ref 8–27)
Bilirubin Total: 0.4 mg/dL (ref 0.0–1.2)
CO2: 26 mmol/L (ref 20–29)
Calcium: 9 mg/dL (ref 8.7–10.3)
Chloride: 101 mmol/L (ref 96–106)
Creatinine, Ser: 0.97 mg/dL (ref 0.57–1.00)
Globulin, Total: 2.7 g/dL (ref 1.5–4.5)
Glucose: 85 mg/dL (ref 70–99)
Potassium: 4.3 mmol/L (ref 3.5–5.2)
Sodium: 140 mmol/L (ref 134–144)
Total Protein: 7 g/dL (ref 6.0–8.5)
eGFR: 62 mL/min/{1.73_m2} (ref >59)

## 2022-09-22 LAB — LIPASE: Lipase: 26 U/L (ref 14–85)

## 2022-09-22 LAB — AMYLASE: Amylase: 63 U/L (ref 31–110)

## 2022-09-22 LAB — TSH: TSH: 0.556 u[IU]/mL (ref 0.450–4.500)

## 2022-09-23 IMAGING — DX DG ABDOMEN 2V
2 series · 2 of 2 positions shown · non-contrast
Comparison: 02/11/2015

CLINICAL DATA: Upper abdominal and mid epigastric pain

EXAM:
ABDOMEN - 2 VIEW

[abdomen erect]
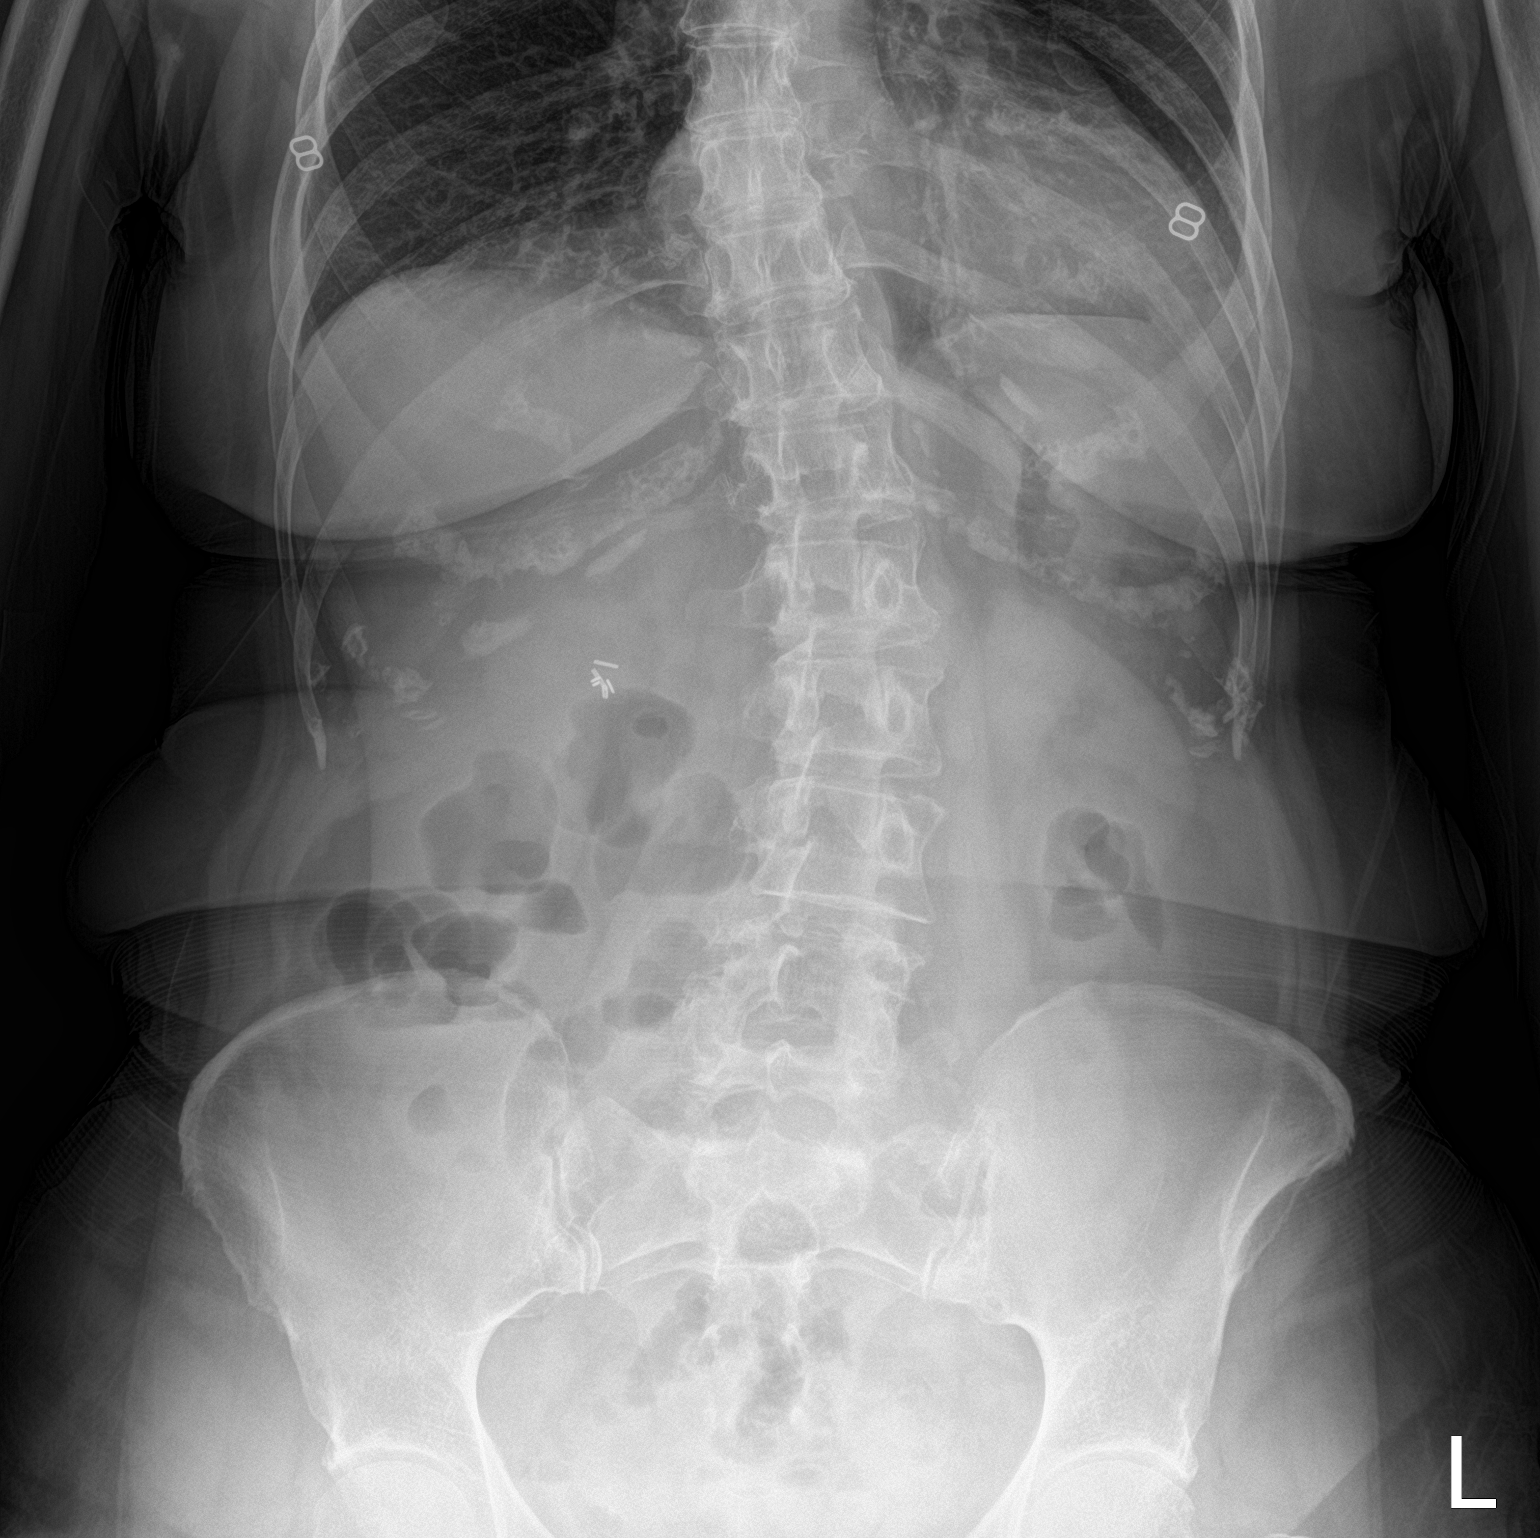

[abdomen supine]
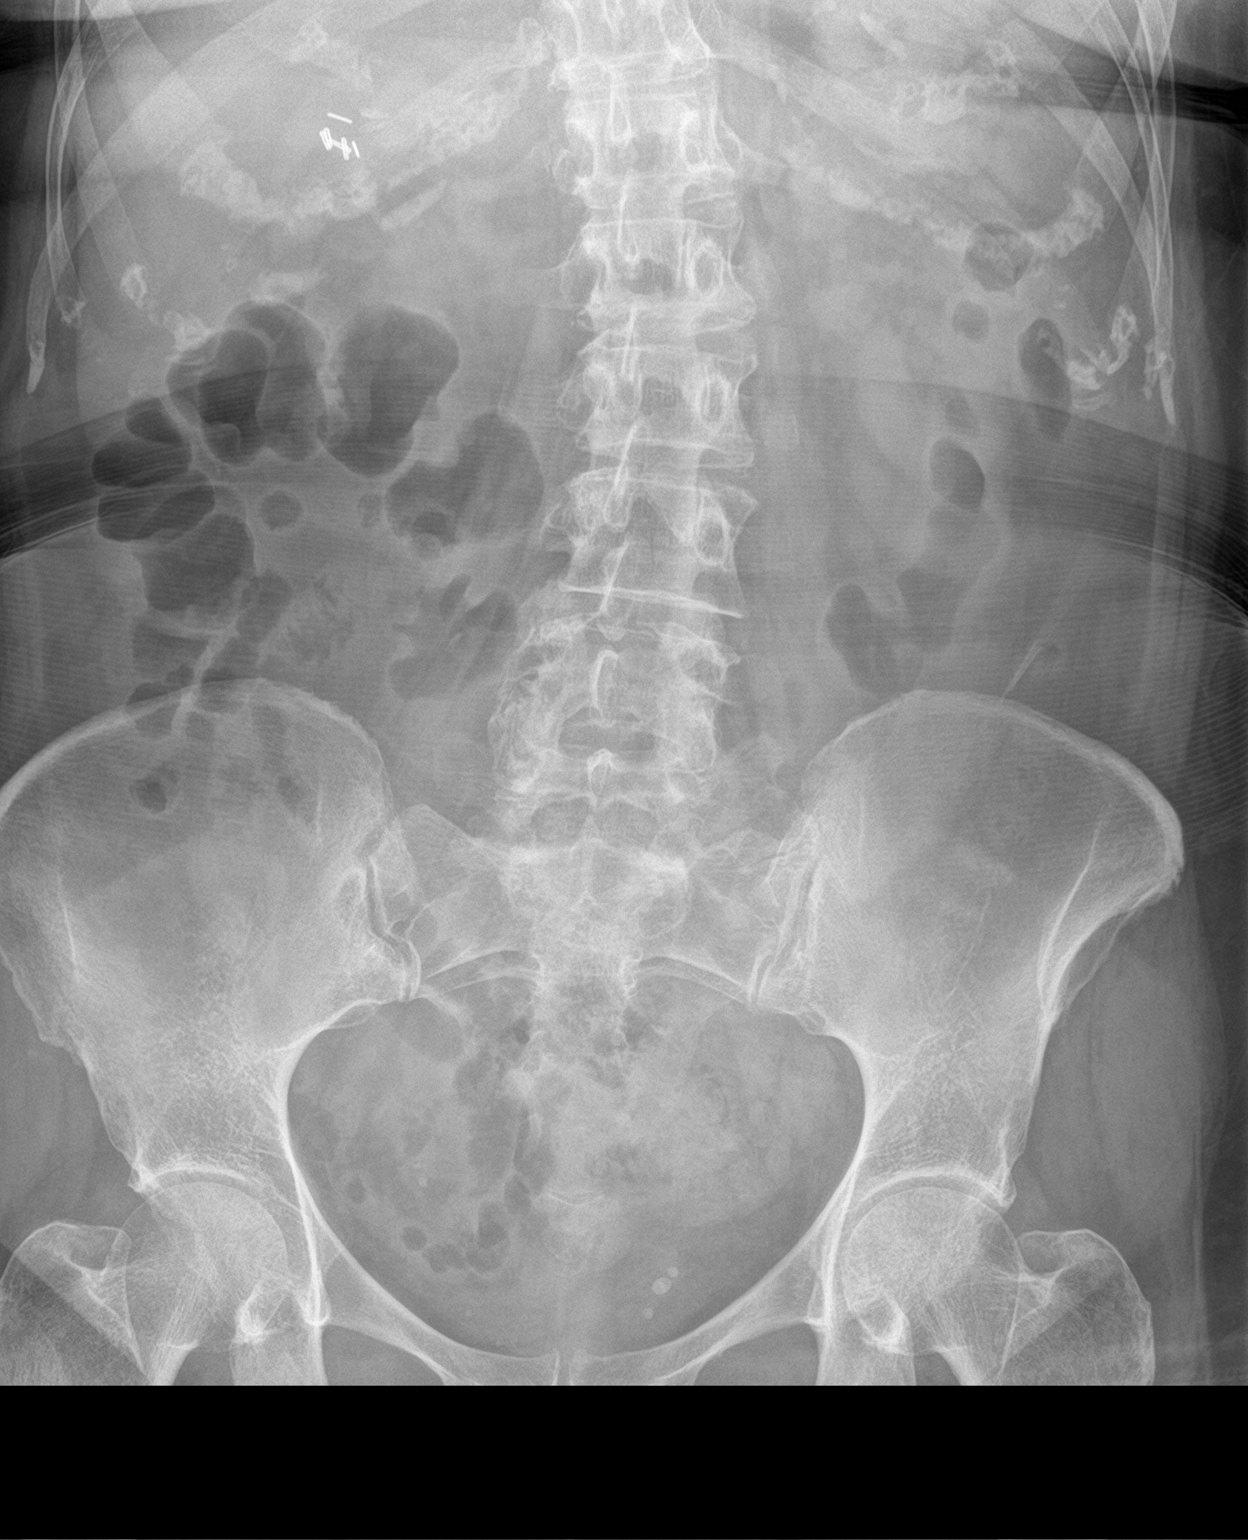

[2 of 2 positions shown; findings below may reference images not displayed]

FINDINGS: Supine and upright frontal views of the abdomen and pelvis are
obtained. No bowel obstruction or ileus. No masses or abnormal
calcifications. No free gas in the greater peritoneal sac. No
evidence of hiatal hernia. No acute bony abnormalities.
IMPRESSION: 1. Unremarkable bowel gas pattern.

## 2022-09-24 ENCOUNTER — Telehealth: Payer: Medicare PPO | Admitting: Sports Medicine

## 2022-09-24 DIAGNOSIS — R197 Diarrhea, unspecified: Secondary | ICD-10-CM | POA: Diagnosis not present

## 2022-09-24 DIAGNOSIS — R112 Nausea with vomiting, unspecified: Secondary | ICD-10-CM | POA: Diagnosis not present

## 2022-09-26 ENCOUNTER — Telehealth: Payer: Self-pay | Admitting: Sports Medicine

## 2022-09-26 LAB — CLOSTRIDIUM DIFFICILE EIA

## 2022-09-26 NOTE — Telephone Encounter (Signed)
Patient called stating her stool sample labs were cancelled by [facility] stating the specimen was not frozen. Please advise.

## 2022-09-27 LAB — STOOL CULTURE: E coli, Shiga toxin Assay: NEGATIVE

## 2022-09-27 NOTE — Telephone Encounter (Signed)
No need to send it in as stat, just let her know that I looked at the x-ray myself, it is normal with the exception of a very large stool burden.  If the diarrhea has resolved we can go ahead and cancel the C. difficile testing, please make sure that she does not get charged for it.  Report preliminarily so far is negative for Salmonella, Shigella, E. coli, still awaiting Campylobacter.  Once the official results of the stool cultures are written it will populate into my chart for her.  Hope that answers everything.

## 2022-09-27 NOTE — Telephone Encounter (Signed)
Spoke with [facility] and will not be charged for C. Dif testing .

## 2022-09-27 NOTE — Telephone Encounter (Signed)
Patient informed. 

## 2022-09-27 NOTE — Telephone Encounter (Signed)
Looks like the cultures are still good but the C. difficile toxin was canceled, this absolutely needs to be collected again.

## 2022-09-27 NOTE — Telephone Encounter (Signed)
Spoke with patient . She is very upset that she has to return for this specimen to be retested and is not sure when she can get back by to collect the supplies for this due to bad weather tomorrow expected. She is no longer having diarrhea so collection would be difficult.  She states she is a Engineer, civil (consulting) and knows that her sample was collected and frozen and  she informed the front desk to give to lab technician and let her know the sample was frozen. She wanted to be sure that Dr. Benjamin Stain knew that she was upset and wanted to be sure we fix whatever happened to cause the sample not to be frozen when sent.  She is also wanting  a copy of the stool culture mailed to her once the results are finalized.  She is also wanting to know her x-ray results as well.  Do you want me to call and have this changed to STAT?

## 2022-10-02 DIAGNOSIS — R110 Nausea: Secondary | ICD-10-CM | POA: Diagnosis not present

## 2022-10-02 DIAGNOSIS — K219 Gastro-esophageal reflux disease without esophagitis: Secondary | ICD-10-CM | POA: Diagnosis not present

## 2022-10-02 DIAGNOSIS — R197 Diarrhea, unspecified: Secondary | ICD-10-CM | POA: Diagnosis not present

## 2022-10-03 DIAGNOSIS — R197 Diarrhea, unspecified: Secondary | ICD-10-CM | POA: Diagnosis not present

## 2022-10-09 DIAGNOSIS — Z01419 Encounter for gynecological examination (general) (routine) without abnormal findings: Secondary | ICD-10-CM | POA: Diagnosis not present

## 2022-10-09 DIAGNOSIS — Z6826 Body mass index (BMI) 26.0-26.9, adult: Secondary | ICD-10-CM | POA: Diagnosis not present

## 2022-10-09 DIAGNOSIS — R61 Generalized hyperhidrosis: Secondary | ICD-10-CM | POA: Diagnosis not present

## 2022-10-09 DIAGNOSIS — R5383 Other fatigue: Secondary | ICD-10-CM | POA: Diagnosis not present

## 2022-10-19 ENCOUNTER — Other Ambulatory Visit: Payer: Self-pay | Admitting: Sports Medicine

## 2022-10-19 ENCOUNTER — Encounter: Payer: Self-pay | Admitting: Sports Medicine

## 2022-10-19 ENCOUNTER — Ambulatory Visit (INDEPENDENT_AMBULATORY_CARE_PROVIDER_SITE_OTHER): Payer: Medicare PPO | Admitting: Sports Medicine

## 2022-10-19 VITALS — BP 114/70 | HR 69 | Ht 61.0 in | Wt 141.0 lb

## 2022-10-19 DIAGNOSIS — R197 Diarrhea, unspecified: Secondary | ICD-10-CM | POA: Diagnosis not present

## 2022-10-19 DIAGNOSIS — R112 Nausea with vomiting, unspecified: Secondary | ICD-10-CM | POA: Diagnosis not present

## 2022-10-19 DIAGNOSIS — R739 Hyperglycemia, unspecified: Secondary | ICD-10-CM | POA: Diagnosis not present

## 2022-10-19 DIAGNOSIS — Z0000 Encounter for general adult medical examination without abnormal findings: Secondary | ICD-10-CM

## 2022-10-19 NOTE — Progress Notes (Signed)
Subjective:    CC: Annual Physical Exam  HPI:  This patient is here for their annual physical  I reviewed the past medical history, family history, social history, surgical history, and allergies today and no changes were needed.  Please see the problem list section below in epic for further details.  Past Medical History: Past Medical History:  Diagnosis Date   Cervical spondylosis     with disk protrusion   Chest pain    Diarrhea    The history was provided by the patient   Family history of breast cancer    Family history of breast cancer gene mutation in first degree relative    Family history of leukemia    Family history of pancreatic cancer    Family history of prostate cancer    Fluttering heart    Irregular heart rate    Irritable bowel syndrome    Osteomyelitis (HCC)    middle phalanx, right index finger   Osteoporosis    Palpitation    Trigeminal neuralgia    Vomiting    The history was provided by the patient   Past Surgical History: Past Surgical History:  Procedure Laterality Date   CERVICAL DISC SURGERY     COLONOSCOPY     with biopsies   KNEE SURGERY     TONSILLECTOMY     TUBAL LIGATION     UPPER GASTROINTESTINAL ENDOSCOPY      with biopsies   Social History: Social History   Socioeconomic History   Marital status: Married    Spouse name: Not on file   Number of children: Not on file   Years of education: Not on file   Highest education level: Bachelor's degree (e.g., BA, AB, BS)  Occupational History   Not on file  Tobacco Use   Smoking status: Never   Smokeless tobacco: Never  Substance and Sexual Activity   Alcohol use: No   Drug use: No   Sexual activity: Never    Birth control/protection: Surgical  Other Topics Concern   Not on file  Social History Narrative   Not on file   Social Determinants of Health   Financial Resource Strain: Low Risk  (10/12/2022)   Overall Financial Resource Strain (CARDIA)    Difficulty of Paying  Living Expenses: Not hard at all  Food Insecurity: No Food Insecurity (10/12/2022)   Hunger Vital Sign    Worried About Running Out of Food in the Last Year: Never true    Ran Out of Food in the Last Year: Never true  Transportation Needs: No Transportation Needs (10/12/2022)   PRAPARE - Administrator, Civil Service (Medical): No    Lack of Transportation (Non-Medical): No  Physical Activity: Insufficiently Active (10/12/2022)   Exercise Vital Sign    Days of Exercise per Week: 2 days    Minutes of Exercise per Session: 10 min  Stress: No Stress Concern Present (10/12/2022)   [employer] of Occupational Health - Occupational Stress Questionnaire    Feeling of Stress : Not at all  Social Connections: Moderately Integrated (10/12/2022)   Social Connection and Isolation Panel [NHANES]    Frequency of Communication with Friends and Family: More than three times a week    Frequency of Social Gatherings with Friends and Family: Once a week    Attends Religious Services: More than 4 times per year    Active Member of [employer] or Organizations: No    Attends Banker Meetings:  Not on file    Marital Status: Married   Family History: Family History  Problem Relation Age of Onset   Hypertension Mother    Coronary artery disease Father    Breast cancer Sister 49   Prostate cancer Brother 18   Pancreatic cancer Maternal Aunt 5   Breast cancer Cousin        dx 33s   Allergies: Allergies  Allergen Reactions   Morphine Anaphylaxis    Swelling    Cymbalta [Duloxetine Hcl] Nausea Only   Azithromycin Nausea And Vomiting   Betadine [Povidone Iodine]    Keflex [Cephalexin] Nausea And Vomiting   Lyrica [Pregabalin] Other (See Comments)    Headache   Povidone-Iodine    Promethazine Hcl    Xarelto [Rivaroxaban] Other (See Comments)    Severe nausea   Septra [Sulfamethoxazole-Trimethoprim] Rash   Medications: See med rec.  Review of Systems: No headache,  visual changes, nausea, vomiting, diarrhea, constipation, dizziness, abdominal pain, skin rash, fevers, chills, night sweats, swollen lymph nodes, weight loss, chest pain, body aches, joint swelling, muscle aches, shortness of breath, mood changes, visual or auditory hallucinations.  Objective:    General: Well Developed, well nourished, and in no acute distress.  Neuro: Alert and oriented x3, extra-ocular muscles intact, sensation grossly intact. Cranial nerves II through XII are intact, motor, sensory, and coordinative functions are all intact. HEENT: Normocephalic, atraumatic, pupils equal round reactive to light, neck supple, no masses, no lymphadenopathy, thyroid nonpalpable. Oropharynx, nasopharynx, external ear canals are unremarkable. Skin: Warm and dry, no rashes noted.  Cardiac: Regular rate and rhythm, no murmurs rubs or gallops.  Respiratory: Clear to auscultation bilaterally. Not using accessory muscles, speaking in full sentences.  Abdominal: Soft, nontender, nondistended, positive bowel sounds, no masses, no organomegaly.  Musculoskeletal: Shoulder, elbow, wrist, hip, knee, ankle stable, and with full range of motion.  Impression and Recommendations:    The patient was counselled, risk factors were discussed, anticipatory guidance given.  Annual physical exam Fasting annual physical as above, up-to-date on screenings, declines Shingrix and flu vaccines.   Nausea vomiting and diarrhea Please see prior notes, symptoms did resolve. She also has an appointment coming up with Dr. Dulce Sellar for upper and lower endoscopy. Of note her C. difficile testing was never drawn.   ____________________________________________ Ihor Austin. Benjamin Stain, M.D., ABFM., CAQSM., AME. Primary Care and Sports Medicine [place] [facility]  Adjunct Professor of Family Medicine  [place] of [facility] of Medicine  Restaurant manager, fast food

## 2022-10-19 NOTE — Assessment & Plan Note (Signed)
Please see prior notes, symptoms did resolve. She also has an appointment coming up with Dr. Dulce Sellar for upper and lower endoscopy. Of note her C. difficile testing was never drawn.

## 2022-10-19 NOTE — Assessment & Plan Note (Signed)
Fasting annual physical as above, up-to-date on screenings, declines Shingrix and flu vaccines.

## 2022-10-20 LAB — HEMOGLOBIN A1C
Est. average glucose Bld gHb Est-mCnc: 117 mg/dL
Hgb A1c MFr Bld: 5.7 % — ABNORMAL HIGH (ref 4.8–5.6)

## 2022-10-20 LAB — LIPID PANEL
Chol/HDL Ratio: 4 {ratio} (ref 0.0–4.4)
Cholesterol, Total: 168 mg/dL (ref 100–199)
HDL: 42 mg/dL (ref >39)
LDL Chol Calc (NIH): 104 mg/dL — ABNORMAL HIGH (ref 0–99)
Triglycerides: 121 mg/dL (ref 0–149)
VLDL Cholesterol Cal: 22 mg/dL (ref 5–40)

## 2022-10-31 DIAGNOSIS — D124 Benign neoplasm of descending colon: Secondary | ICD-10-CM | POA: Diagnosis not present

## 2022-10-31 DIAGNOSIS — R1084 Generalized abdominal pain: Secondary | ICD-10-CM | POA: Diagnosis not present

## 2022-10-31 DIAGNOSIS — K219 Gastro-esophageal reflux disease without esophagitis: Secondary | ICD-10-CM | POA: Diagnosis not present

## 2022-10-31 DIAGNOSIS — R110 Nausea: Secondary | ICD-10-CM | POA: Diagnosis not present

## 2022-10-31 DIAGNOSIS — K317 Polyp of stomach and duodenum: Secondary | ICD-10-CM | POA: Diagnosis not present

## 2022-10-31 DIAGNOSIS — R197 Diarrhea, unspecified: Secondary | ICD-10-CM | POA: Diagnosis not present

## 2022-10-31 DIAGNOSIS — D123 Benign neoplasm of transverse colon: Secondary | ICD-10-CM | POA: Diagnosis not present

## 2022-10-31 DIAGNOSIS — K5730 Diverticulosis of large intestine without perforation or abscess without bleeding: Secondary | ICD-10-CM | POA: Diagnosis not present

## 2022-10-31 DIAGNOSIS — K319 Disease of stomach and duodenum, unspecified: Secondary | ICD-10-CM | POA: Diagnosis not present

## 2022-10-31 LAB — HM COLONOSCOPY

## 2022-11-02 DIAGNOSIS — K317 Polyp of stomach and duodenum: Secondary | ICD-10-CM | POA: Diagnosis not present

## 2022-11-02 DIAGNOSIS — D123 Benign neoplasm of transverse colon: Secondary | ICD-10-CM | POA: Diagnosis not present

## 2022-11-02 DIAGNOSIS — D124 Benign neoplasm of descending colon: Secondary | ICD-10-CM | POA: Diagnosis not present

## 2022-11-02 DIAGNOSIS — K319 Disease of stomach and duodenum, unspecified: Secondary | ICD-10-CM | POA: Diagnosis not present

## 2022-11-13 ENCOUNTER — Ambulatory Visit: Payer: Medicare PPO

## 2022-11-13 ENCOUNTER — Encounter: Payer: Self-pay | Admitting: Sports Medicine

## 2022-11-13 ENCOUNTER — Ambulatory Visit (INDEPENDENT_AMBULATORY_CARE_PROVIDER_SITE_OTHER): Payer: Medicare PPO | Admitting: Sports Medicine

## 2022-11-13 VITALS — BP 119/69 | HR 89

## 2022-11-13 DIAGNOSIS — R051 Acute cough: Secondary | ICD-10-CM

## 2022-11-13 DIAGNOSIS — R5381 Other malaise: Secondary | ICD-10-CM | POA: Diagnosis not present

## 2022-11-13 DIAGNOSIS — M791 Myalgia, unspecified site: Secondary | ICD-10-CM | POA: Diagnosis not present

## 2022-11-13 DIAGNOSIS — R2231 Localized swelling, mass and lump, right upper limb: Secondary | ICD-10-CM

## 2022-11-13 DIAGNOSIS — R509 Fever, unspecified: Secondary | ICD-10-CM | POA: Diagnosis not present

## 2022-11-13 DIAGNOSIS — J984 Other disorders of lung: Secondary | ICD-10-CM

## 2022-11-13 LAB — POCT INFLUENZA A/B
Influenza A, POC: NEGATIVE
Influenza B, POC: NEGATIVE

## 2022-11-13 MED ORDER — PREDNISONE 50 MG PO TABS
50.0000 mg | ORAL_TABLET | Freq: Every day | ORAL | 0 refills | Status: DC
Start: 2022-11-13 — End: 2023-06-18

## 2022-11-13 MED ORDER — HYDROCOD POLI-CHLORPHE POLI ER 10-8 MG/5ML PO SUER
5.0000 mL | Freq: Two times a day (BID) | ORAL | 0 refills | Status: DC | PRN
Start: 2022-11-13 — End: 2022-11-22

## 2022-11-13 MED ORDER — DOXYCYCLINE HYCLATE 100 MG PO TABS
100.0000 mg | ORAL_TABLET | Freq: Two times a day (BID) | ORAL | 0 refills | Status: AC
Start: 2022-11-13 — End: 2022-11-20

## 2022-11-13 NOTE — Progress Notes (Signed)
    Procedures performed today:    None.  Independent interpretation of notes and tests performed by another provider:   None.  Brief History, Exam, Impression, and Recommendations:    Coughing Pleasant 72 year old female, 4 days of muscle aches, malaise, headache, coughing, sore throat. Low-grade fevers, no overt shortness of breath, mild facial pressure and pain. No GI symptoms. COVID test negative at home Speaking full sentences, exam does reveal focal wheezes left lower lobe. She also has visible postnasal drip with yellowish mucus posterior oropharynx. Flu test negative today. Due to wheezes we are going to look for a focal consolidation/infiltrate, chest x-ray, prednisone, Tussionex, doxycycline.  Mass of skin of shoulder, right Patty is also noted a subcutaneous well-defined movable mass right trapezius. Feels like a lipoma, we will add an ultrasound.    ____________________________________________ Ihor Austin. Benjamin Stain, M.D., ABFM., CAQSM., AME. Primary Care and Sports Medicine [place] [facility]  Adjunct Professor of Family Medicine  [place] of [facility] of Medicine  Restaurant manager, fast food

## 2022-11-13 NOTE — Assessment & Plan Note (Signed)
Kathleen Garcia is also noted a subcutaneous well-defined movable mass right trapezius. Feels like a lipoma, we will add an ultrasound.

## 2022-11-13 NOTE — Assessment & Plan Note (Addendum)
Pleasant 72 year old female, 4 days of muscle aches, malaise, headache, coughing, sore throat. Low-grade fevers, no overt shortness of breath, mild facial pressure and pain. No GI symptoms. COVID test negative at home Speaking full sentences, exam does reveal focal wheezes left lower lobe. She also has visible postnasal drip with yellowish mucus posterior oropharynx. Flu test negative today. Due to wheezes we are going to look for a focal consolidation/infiltrate, chest x-ray, prednisone, Tussionex, doxycycline.

## 2022-11-16 ENCOUNTER — Ambulatory Visit: Payer: Medicare PPO | Admitting: Sports Medicine

## 2022-11-20 ENCOUNTER — Encounter: Payer: Self-pay | Admitting: Sports Medicine

## 2022-11-20 DIAGNOSIS — R051 Acute cough: Secondary | ICD-10-CM

## 2022-11-21 ENCOUNTER — Other Ambulatory Visit: Payer: Self-pay | Admitting: Sports Medicine

## 2022-11-22 MED ORDER — HYDROCOD POLI-CHLORPHE POLI ER 10-8 MG/5ML PO SUER: 5.0000 mL | Freq: Two times a day (BID) | ORAL | 0 refills | Status: DC | PRN

## 2022-12-10 DIAGNOSIS — R6882 Decreased libido: Secondary | ICD-10-CM | POA: Diagnosis not present

## 2022-12-11 ENCOUNTER — Encounter (INDEPENDENT_AMBULATORY_CARE_PROVIDER_SITE_OTHER): Payer: Medicare PPO | Admitting: Sports Medicine

## 2022-12-11 DIAGNOSIS — R052 Subacute cough: Secondary | ICD-10-CM

## 2022-12-11 NOTE — Telephone Encounter (Signed)

## 2023-01-12 ENCOUNTER — Other Ambulatory Visit: Payer: Self-pay | Admitting: Sports Medicine

## 2023-01-12 DIAGNOSIS — R002 Palpitations: Secondary | ICD-10-CM

## 2023-01-25 DIAGNOSIS — K219 Gastro-esophageal reflux disease without esophagitis: Secondary | ICD-10-CM | POA: Diagnosis not present

## 2023-01-25 DIAGNOSIS — R110 Nausea: Secondary | ICD-10-CM | POA: Diagnosis not present

## 2023-01-25 DIAGNOSIS — R109 Unspecified abdominal pain: Secondary | ICD-10-CM | POA: Diagnosis not present

## 2023-01-25 DIAGNOSIS — R197 Diarrhea, unspecified: Secondary | ICD-10-CM | POA: Diagnosis not present

## 2023-01-25 DIAGNOSIS — Z860100 Personal history of colon polyps, unspecified: Secondary | ICD-10-CM | POA: Diagnosis not present

## 2023-01-28 ENCOUNTER — Encounter (INDEPENDENT_AMBULATORY_CARE_PROVIDER_SITE_OTHER): Payer: Medicare PPO | Admitting: Sports Medicine

## 2023-01-28 DIAGNOSIS — B029 Zoster without complications: Secondary | ICD-10-CM

## 2023-01-28 MED ORDER — VALACYCLOVIR HCL 1 G PO TABS: 1000.0000 mg | ORAL_TABLET | Freq: Two times a day (BID) | ORAL | 2 refills | Status: AC

## 2023-01-28 NOTE — Telephone Encounter (Signed)

## 2023-01-31 ENCOUNTER — Ambulatory Visit: Payer: Medicare PPO | Admitting: Sports Medicine

## 2023-01-31 VITALS — BP 117/69 | HR 75 | Ht 61.0 in | Wt 142.0 lb

## 2023-01-31 DIAGNOSIS — B029 Zoster without complications: Secondary | ICD-10-CM | POA: Diagnosis not present

## 2023-01-31 NOTE — Assessment & Plan Note (Signed)
73 year old female, recent onset shingles left thoracic distribution, we did get Valtrex started prior to her visit, she is doing better, she will double her gabapentin for her postherpetic neuralgia symptoms. Nothing else to do today. Declines Shingrix.

## 2023-01-31 NOTE — Progress Notes (Signed)
    Procedures performed today:    None.  Independent interpretation of notes and tests performed by another provider:   None.  Brief History, Exam, Impression, and Recommendations:    Shingles 73 year old female, recent onset shingles left thoracic distribution, we did get Valtrex started prior to her visit, she is doing better, she will double her gabapentin for her postherpetic neuralgia symptoms. Nothing else to do today. Declines Shingrix.    ____________________________________________ Ihor Austin. Benjamin Stain, M.D., ABFM., CAQSM., AME. Primary Care and Sports Medicine [place] [facility]  Adjunct Professor of Family Medicine  [place] of [facility] of Medicine  Restaurant manager, fast food

## 2023-02-04 ENCOUNTER — Encounter: Payer: Self-pay | Admitting: Sports Medicine

## 2023-02-06 MED ORDER — GABAPENTIN 300 MG PO CAPS: 300.0000 mg | ORAL_CAPSULE | Freq: Four times a day (QID) | ORAL | 3 refills | Status: DC

## 2023-02-13 DIAGNOSIS — R6882 Decreased libido: Secondary | ICD-10-CM | POA: Diagnosis not present

## 2023-03-05 DIAGNOSIS — H2513 Age-related nuclear cataract, bilateral: Secondary | ICD-10-CM | POA: Diagnosis not present

## 2023-03-05 DIAGNOSIS — H524 Presbyopia: Secondary | ICD-10-CM | POA: Diagnosis not present

## 2023-04-11 DIAGNOSIS — L570 Actinic keratosis: Secondary | ICD-10-CM | POA: Diagnosis not present

## 2023-04-11 DIAGNOSIS — D1801 Hemangioma of skin and subcutaneous tissue: Secondary | ICD-10-CM | POA: Diagnosis not present

## 2023-04-11 DIAGNOSIS — L821 Other seborrheic keratosis: Secondary | ICD-10-CM | POA: Diagnosis not present

## 2023-04-11 DIAGNOSIS — L820 Inflamed seborrheic keratosis: Secondary | ICD-10-CM | POA: Diagnosis not present

## 2023-04-11 DIAGNOSIS — C44519 Basal cell carcinoma of skin of other part of trunk: Secondary | ICD-10-CM | POA: Diagnosis not present

## 2023-04-11 DIAGNOSIS — L718 Other rosacea: Secondary | ICD-10-CM | POA: Diagnosis not present

## 2023-04-11 DIAGNOSIS — L814 Other melanin hyperpigmentation: Secondary | ICD-10-CM | POA: Diagnosis not present

## 2023-04-17 ENCOUNTER — Other Ambulatory Visit: Payer: Self-pay | Admitting: Sports Medicine

## 2023-04-17 DIAGNOSIS — R6882 Decreased libido: Secondary | ICD-10-CM | POA: Diagnosis not present

## 2023-04-17 NOTE — Telephone Encounter (Signed)
 Last OV: 01/31/23 Next OV: no appt scheduled Last RF: 10/19/22

## 2023-04-25 DIAGNOSIS — R109 Unspecified abdominal pain: Secondary | ICD-10-CM | POA: Diagnosis not present

## 2023-04-25 DIAGNOSIS — R197 Diarrhea, unspecified: Secondary | ICD-10-CM | POA: Diagnosis not present

## 2023-04-25 DIAGNOSIS — K219 Gastro-esophageal reflux disease without esophagitis: Secondary | ICD-10-CM | POA: Diagnosis not present

## 2023-04-25 DIAGNOSIS — Z860100 Personal history of colon polyps, unspecified: Secondary | ICD-10-CM | POA: Diagnosis not present

## 2023-05-08 DIAGNOSIS — Z85828 Personal history of other malignant neoplasm of skin: Secondary | ICD-10-CM | POA: Diagnosis not present

## 2023-05-08 DIAGNOSIS — L718 Other rosacea: Secondary | ICD-10-CM | POA: Diagnosis not present

## 2023-05-08 DIAGNOSIS — C44519 Basal cell carcinoma of skin of other part of trunk: Secondary | ICD-10-CM | POA: Diagnosis not present

## 2023-05-08 DIAGNOSIS — L578 Other skin changes due to chronic exposure to nonionizing radiation: Secondary | ICD-10-CM | POA: Diagnosis not present

## 2023-05-14 ENCOUNTER — Other Ambulatory Visit: Payer: Self-pay | Admitting: Sports Medicine

## 2023-05-14 DIAGNOSIS — Z1231 Encounter for screening mammogram for malignant neoplasm of breast: Secondary | ICD-10-CM

## 2023-06-18 ENCOUNTER — Encounter: Payer: Self-pay | Admitting: Sports Medicine

## 2023-06-18 ENCOUNTER — Telehealth (INDEPENDENT_AMBULATORY_CARE_PROVIDER_SITE_OTHER): Admitting: Sports Medicine

## 2023-06-18 DIAGNOSIS — R1013 Epigastric pain: Secondary | ICD-10-CM | POA: Diagnosis not present

## 2023-06-18 MED ORDER — KETOCONAZOLE 200 MG PO TABS: 200.0000 mg | ORAL_TABLET | Freq: Every day | ORAL | 0 refills | Status: AC

## 2023-06-18 NOTE — Assessment & Plan Note (Signed)
 Kathleen Garcia is a very pleasant 73 year old female, she has [place] history of GI discomfort, irritable bowel syndrome with both mixed constipated and diarrhea. She has been working with Dr. Kimble Pennant with gastroenterology and has had several changes in her medication regimen. She was recently started on Florastor which she feels as though has given her some more diarrhea. She also feels as though the Carafate  has given her some constipation. Principal symptom at this juncture though is bloating and midepigastric pain. She denies any melena, hematochezia, hematemesis. She stools about 6 times per day. She has significant nausea. Currently taking pantoprazole , famotidine , Carafate . In the past she has been treated for yeast overgrowth syndrome, she was given Nizoral  in the past that did seem to help, she is convinced this is yeast overgrowth and has requested Nizoral , I am happy to do this however I do think this is more of a gastritis flare. With the above we will get some labs including a lipase, we will get some stool studies, continue pantoprazole , famotidine , I will call in the Nizoral . She can stop the Florastor and the Carafate  for now. Follow-up does need to be with gastroenterology.

## 2023-06-18 NOTE — Progress Notes (Signed)
   Virtual Visit via WebEx/MyChart   I connected with  Kathleen Garcia  on 06/18/23 via WebEx/MyChart/Doximity Video and verified that I am speaking with the correct person using two identifiers.   I discussed the limitations, risks, security and privacy concerns of performing an evaluation and management service by WebEx/MyChart/Doximity Video, including the higher likelihood of inaccurate diagnosis and treatment, and the availability of in person appointments.  We also discussed the likely need of an additional face to face encounter for complete and high quality delivery of care.  I also discussed with the patient that there may be a patient responsible charge related to this service. The patient expressed understanding and wishes to proceed.  Provider location is in medical facility. Patient location is at their home, different from provider location. People involved in care of the patient during this telehealth encounter were myself, my nurse/medical assistant, and my front office/scheduling team member.  Review of Systems: No fevers, chills, night sweats, weight loss, chest pain, or shortness of breath.   Objective Findings:    General: Speaking full sentences, no audible heavy breathing.  Sounds alert and appropriately interactive.  Appears well.  Face symmetric.  Extraocular movements intact.  Pupils equal and round.  No nasal flaring or accessory muscle use visualized.  Independent interpretation of tests performed by another provider:   None.  Brief History, Exam, Impression, and Recommendations:    Midepigastric pain Kathleen Garcia is a very pleasant 73 year old female, she has [place] history of GI discomfort, irritable bowel syndrome with both mixed constipated and diarrhea. She has been working with Dr. Kimble Pennant with gastroenterology and has had several changes in her medication regimen. She was recently started on Florastor which she feels as though has given her some more diarrhea. She  also feels as though the Carafate  has given her some constipation. Principal symptom at this juncture though is bloating and midepigastric pain. She denies any melena, hematochezia, hematemesis. She stools about 6 times per day. She has significant nausea. Currently taking pantoprazole , famotidine , Carafate . In the past she has been treated for yeast overgrowth syndrome, she was given Nizoral  in the past that did seem to help, she is convinced this is yeast overgrowth and has requested Nizoral , I am happy to do this however I do think this is more of a gastritis flare. With the above we will get some labs including a lipase, we will get some stool studies, continue pantoprazole , famotidine , I will call in the Nizoral . She can stop the Florastor and the Carafate  for now. Follow-up does need to be with gastroenterology.   I discussed the above assessment and treatment plan with the patient. The patient was provided an opportunity to ask questions and all were answered. The patient agreed with the plan and demonstrated an understanding of the instructions.   The patient was advised to call back or seek an in-person evaluation if the symptoms worsen or if the condition fails to improve as anticipated.   I provided 30 minutes of face to face and non-face-to-face time during this encounter date, time was needed to gather information, review chart, records, communicate/coordinate with staff remotely, as well as complete documentation.   ____________________________________________ Joselyn Nicely. [name], M.D., ABFM., CAQSM., AME. Primary Care and Sports Medicine [place] [facility]  Adjunct Professor of [facility] Medicine  [employer]  Restaurant manager, fast food

## 2023-06-19 DIAGNOSIS — R1013 Epigastric pain: Secondary | ICD-10-CM | POA: Diagnosis not present

## 2023-06-24 ENCOUNTER — Encounter: Payer: Self-pay | Admitting: Sports Medicine

## 2023-06-24 DIAGNOSIS — R1013 Epigastric pain: Secondary | ICD-10-CM | POA: Diagnosis not present

## 2023-06-25 LAB — COMPREHENSIVE METABOLIC PANEL WITH GFR
ALT: 12 IU/L (ref 0–32)
AST: 21 IU/L (ref 0–40)
Albumin: 4 g/dL (ref 3.8–4.8)
Alkaline Phosphatase: 94 IU/L (ref 44–121)
BUN/Creatinine Ratio: 10 — ABNORMAL LOW (ref 12–28)
BUN: 10 mg/dL (ref 8–27)
Bilirubin Total: 0.4 mg/dL (ref 0.0–1.2)
CO2: 25 mmol/L (ref 20–29)
Calcium: 9 mg/dL (ref 8.7–10.3)
Chloride: 103 mmol/L (ref 96–106)
Creatinine, Ser: 1.01 mg/dL — ABNORMAL HIGH (ref 0.57–1.00)
Globulin, Total: 2.9 g/dL (ref 1.5–4.5)
Glucose: 106 mg/dL — ABNORMAL HIGH (ref 70–99)
Potassium: 3.5 mmol/L (ref 3.5–5.2)
Sodium: 143 mmol/L (ref 134–144)
Total Protein: 6.9 g/dL (ref 6.0–8.5)
eGFR: 59 mL/min/{1.73_m2} — ABNORMAL LOW (ref >59)

## 2023-06-25 LAB — CBC WITH DIFFERENTIAL/PLATELET
Basophils Absolute: 0.1 x10E3/uL (ref 0.0–0.2)
Basos: 1 %
EOS (ABSOLUTE): 0.2 x10E3/uL (ref 0.0–0.4)
Eos: 4 %
Hematocrit: 40.7 % (ref 34.0–46.6)
Hemoglobin: 13.5 g/dL (ref 11.1–15.9)
Immature Grans (Abs): 0 x10E3/uL (ref 0.0–0.1)
Immature Granulocytes: 0 %
Lymphocytes Absolute: 2.4 10*3/uL (ref 0.7–3.1)
Lymphs: 37 %
MCH: 30.3 pg (ref 26.6–33.0)
MCHC: 33.2 g/dL (ref 31.5–35.7)
MCV: 91 fL (ref 79–97)
Monocytes Absolute: 0.5 x10E3/uL (ref 0.1–0.9)
Monocytes: 8 %
Neutrophils Absolute: 3.2 10*3/uL (ref 1.4–7.0)
Neutrophils: 50 %
Platelets: 270 10*3/uL (ref 150–450)
RBC: 4.46 x10E6/uL (ref 3.77–5.28)
RDW: 12.7 % (ref 11.7–15.4)
WBC: 6.3 10*3/uL (ref 3.4–10.8)

## 2023-06-25 LAB — TSH: TSH: 0.779 u[IU]/mL (ref 0.450–4.500)

## 2023-06-25 LAB — LIPASE: Lipase: 30 U/L (ref 14–85)

## 2023-06-25 LAB — E. HISTOLYTICA ANTIBODY (AMOEBA AB): E histolytica Ab: NEGATIVE

## 2023-06-26 DIAGNOSIS — R6882 Decreased libido: Secondary | ICD-10-CM | POA: Diagnosis not present

## 2023-06-27 ENCOUNTER — Other Ambulatory Visit: Payer: Self-pay | Admitting: Sports Medicine

## 2023-06-27 ENCOUNTER — Ambulatory Visit (INDEPENDENT_AMBULATORY_CARE_PROVIDER_SITE_OTHER): Payer: Self-pay | Admitting: Sports Medicine

## 2023-06-27 DIAGNOSIS — R1013 Epigastric pain: Secondary | ICD-10-CM

## 2023-06-28 LAB — STOOL CULTURE: E coli, Shiga toxin Assay: NEGATIVE

## 2023-07-01 ENCOUNTER — Telehealth: Payer: Self-pay | Admitting: Sports Medicine

## 2023-07-01 ENCOUNTER — Ambulatory Visit

## 2023-07-01 ENCOUNTER — Other Ambulatory Visit: Payer: Self-pay | Admitting: Sports Medicine

## 2023-07-01 DIAGNOSIS — K5730 Diverticulosis of large intestine without perforation or abscess without bleeding: Secondary | ICD-10-CM | POA: Diagnosis not present

## 2023-07-01 DIAGNOSIS — R109 Unspecified abdominal pain: Secondary | ICD-10-CM

## 2023-07-01 DIAGNOSIS — K429 Umbilical hernia without obstruction or gangrene: Secondary | ICD-10-CM | POA: Diagnosis not present

## 2023-07-01 DIAGNOSIS — F411 Generalized anxiety disorder: Secondary | ICD-10-CM

## 2023-07-01 DIAGNOSIS — R197 Diarrhea, unspecified: Secondary | ICD-10-CM | POA: Diagnosis not present

## 2023-07-01 DIAGNOSIS — K8689 Other specified diseases of pancreas: Secondary | ICD-10-CM | POA: Diagnosis not present

## 2023-07-01 DIAGNOSIS — R1013 Epigastric pain: Secondary | ICD-10-CM

## 2023-07-01 MED ORDER — LORAZEPAM 0.5 MG PO TABS: 0.5000 mg | ORAL_TABLET | Freq: Every day | ORAL | 3 refills | Status: AC | PRN

## 2023-07-01 MED ORDER — IOHEXOL 300 MG/ML  SOLN
100.0000 mL | Freq: Once | INTRAMUSCULAR | Status: AC | PRN
  Administered 2023-07-01: 100 mL via INTRAVENOUS

## 2023-07-01 NOTE — Telephone Encounter (Signed)
 error

## 2023-07-02 NOTE — Telephone Encounter (Signed)

## 2023-07-03 ENCOUNTER — Ambulatory Visit
Admission: RE | Admit: 2023-07-03 | Discharge: 2023-07-03 | Disposition: A | Discharge status: Home / Self Care | Source: Ambulatory Visit | Attending: Sports Medicine | Admitting: Sports Medicine

## 2023-07-03 DIAGNOSIS — Z1231 Encounter for screening mammogram for malignant neoplasm of breast: Secondary | ICD-10-CM | POA: Diagnosis not present

## 2023-07-08 ENCOUNTER — Ambulatory Visit: Payer: Self-pay | Admitting: Sports Medicine

## 2023-07-28 ENCOUNTER — Other Ambulatory Visit: Payer: Self-pay | Admitting: Sports Medicine

## 2023-08-01 DIAGNOSIS — Z860100 Personal history of colon polyps, unspecified: Secondary | ICD-10-CM | POA: Diagnosis not present

## 2023-08-01 DIAGNOSIS — R197 Diarrhea, unspecified: Secondary | ICD-10-CM | POA: Diagnosis not present

## 2023-08-01 DIAGNOSIS — K219 Gastro-esophageal reflux disease without esophagitis: Secondary | ICD-10-CM | POA: Diagnosis not present

## 2023-08-01 DIAGNOSIS — R109 Unspecified abdominal pain: Secondary | ICD-10-CM | POA: Diagnosis not present

## 2023-08-26 DIAGNOSIS — R6882 Decreased libido: Secondary | ICD-10-CM | POA: Diagnosis not present

## 2023-09-03 ENCOUNTER — Encounter: Payer: Self-pay | Admitting: Sports Medicine

## 2023-10-21 ENCOUNTER — Encounter: Admitting: Sports Medicine

## 2023-10-24 ENCOUNTER — Encounter: Admitting: Sports Medicine

## 2023-11-05 ENCOUNTER — Encounter: Admitting: Physician Assistant

## 2023-11-05 ENCOUNTER — Encounter: Payer: Self-pay | Admitting: Physician Assistant

## 2023-11-05 ENCOUNTER — Ambulatory Visit: Admitting: Physician Assistant

## 2023-11-05 VITALS — BP 117/50 | HR 67 | Ht 61.0 in | Wt 141.0 lb

## 2023-11-05 DIAGNOSIS — M47816 Spondylosis without myelopathy or radiculopathy, lumbar region: Secondary | ICD-10-CM

## 2023-11-05 DIAGNOSIS — R1013 Epigastric pain: Secondary | ICD-10-CM

## 2023-11-05 DIAGNOSIS — Z0000 Encounter for general adult medical examination without abnormal findings: Secondary | ICD-10-CM

## 2023-11-05 DIAGNOSIS — Z131 Encounter for screening for diabetes mellitus: Secondary | ICD-10-CM | POA: Diagnosis not present

## 2023-11-05 DIAGNOSIS — N951 Menopausal and female climacteric states: Secondary | ICD-10-CM | POA: Insufficient documentation

## 2023-11-05 DIAGNOSIS — R002 Palpitations: Secondary | ICD-10-CM | POA: Diagnosis not present

## 2023-11-05 DIAGNOSIS — Z13220 Encounter for screening for lipoid disorders: Secondary | ICD-10-CM

## 2023-11-05 DIAGNOSIS — Z7689 Persons encountering health services in other specified circumstances: Secondary | ICD-10-CM | POA: Insufficient documentation

## 2023-11-05 DIAGNOSIS — D126 Benign neoplasm of colon, unspecified: Secondary | ICD-10-CM | POA: Insufficient documentation

## 2023-11-05 DIAGNOSIS — K580 Irritable bowel syndrome with diarrhea: Secondary | ICD-10-CM

## 2023-11-05 MED ORDER — GABAPENTIN 300 MG PO CAPS: 300.0000 mg | ORAL_CAPSULE | Freq: Four times a day (QID) | ORAL | 3 refills | Status: AC

## 2023-11-05 MED ORDER — NEBIVOLOL HCL 5 MG PO TABS: 5.0000 mg | ORAL_TABLET | Freq: Every day | ORAL | 3 refills | Status: AC

## 2023-11-05 MED ORDER — PANTOPRAZOLE SODIUM 40 MG PO TBEC: 40.0000 mg | DELAYED_RELEASE_TABLET | Freq: Two times a day (BID) | ORAL | 1 refills | Status: AC

## 2023-11-05 NOTE — Progress Notes (Signed)
 New Patient Office Visit  Subjective    Patient ID: Kathleen Garcia, female    DOB: 08-27-1950  Age: 73 y.o. MRN: [id number]  CC: No chief complaint on file.   HPI .SABRADiscussed the use of AI scribe software for clinical note transcription with the patient, who gave verbal consent to proceed.  History of Present Illness Kathleen Garcia is a 73 year old female who presents for a routine follow-up visit and transfer of care.   Vasomotor symptoms and hormone therapy - History of bilateral pulmonary embolism in 2014, resulting in discontinuation of hormone replacement therapy - Currently receives testosterone  injections every other month for management of hot flashes - Hot flashes have decreased from 15-18 episodes per day to significantly fewer episodes - managed by gyneocology  Herpes zoster recurrence - History of four episodes of shingles - Has declined shingles vaccination  Gastrointestinal symptoms and weight loss - Severe episode of gastritis over the summer, with severe nausea and decreased appetite - Resulted in 8-pound weight loss - Treated with Nizoral  for 30 days, with resolution of symptoms - Previous episode of gastritis occurred ten years prior  Irritable bowel syndrome and anxiety - Diagnosed with irritable bowel syndrome - Experiences anxiety-related symptoms, especially when traveling and concerned about bathroom access - Uses lorazepam  and Lomotil  as needed for symptom management during travel  Analgesic and neuropathic medication use - Currently taking gabapentin  - Uses tramadol  as needed but very sparingly  Cancer screening and preventive care - Mammogram is up to date - Colonoscopy in October revealed a polyp; repeat procedure recommended in three years   Outpatient Encounter Medications as of 11/05/2023  Medication Sig   diphenoxylate -atropine  (LOMOTIL ) 2.5-0.025 MG tablet One to 2 tablets by mouth 4 times a day as needed for diarrhea.    famotidine  (PEPCID ) 20 MG tablet TAKE 1 TABLET BY MOUTH TWICE A DAY   fluticasone  (FLONASE ) 50 MCG/ACT nasal spray One spray in each nostril twice a day, use left hand for right nostril, and right hand for left nostril. (Patient taking differently: Place 2 sprays into both nostrils as needed for allergies or rhinitis. One spray in each nostril twice a day, use left hand for right nostril, and right hand for left nostril.)   gabapentin  (NEURONTIN ) 300 MG capsule Take 1 capsule (300 mg total) by mouth 4 (four) times daily.   ipratropium (ATROVENT ) 0.06 % nasal spray Place 2 sprays into both nostrils 4 (four) times daily.   ketoconazole  (NIZORAL ) 200 MG tablet Take 1 tablet (200 mg total) by mouth daily.   LORazepam  (ATIVAN ) 0.5 MG tablet Take 1 tablet (0.5 mg total) by mouth daily as needed for anxiety.   montelukast  (SINGULAIR ) 10 MG tablet TAKE 1 TABLET BY MOUTH EVERY DAY IN THE MORNING   nebivolol  (BYSTOLIC ) 5 MG tablet Take 1 tablet (5 mg total) by mouth daily.   pantoprazole  (PROTONIX ) 40 MG tablet Take 1 tablet (40 mg total) by mouth 2 (two) times daily.   traMADol  (ULTRAM ) 50 MG tablet Take 1-2 tablets (50-100 mg total) by mouth every 8 (eight) hours as needed for moderate pain. Maximum 6 tabs per day.   valACYclovir  (VALTREX ) 1000 MG tablet Take 1 tablet (1,000 mg total) by mouth 2 (two) times daily.   [DISCONTINUED] gabapentin  (NEURONTIN ) 300 MG capsule Take 1 capsule (300 mg total) by mouth 4 (four) times daily.   [DISCONTINUED] hyoscyamine  (LEVSIN ) 0.125 MG tablet TAKE 1 TABLET BY MOUTH EVERY 4 HOURS AS NEEDED FOR CRAMPING.   [  DISCONTINUED] nebivolol  (BYSTOLIC ) 5 MG tablet TAKE 1 TABLET (5 MG TOTAL) BY MOUTH DAILY.   [DISCONTINUED] ondansetron  (ZOFRAN -ODT) 8 MG disintegrating tablet TAKE 1 TABLET BY MOUTH EVERY 8 HOURS AS NEEDED FOR NAUSEA   [DISCONTINUED] pantoprazole  (PROTONIX ) 40 MG tablet Take 1 tablet (40 mg total) by mouth 2 (two) times daily.   No facility-administered encounter  medications on file as of 11/05/2023.    Past Medical History:  Diagnosis Date   Cervical spondylosis     with disk protrusion   Chest pain    Diarrhea    The history was provided by the patient   Family history of breast cancer    Family history of breast cancer gene mutation in first degree relative    Family history of leukemia    Family history of pancreatic cancer    Family history of prostate cancer    Fluttering heart    Irregular heart rate    Irritable bowel syndrome    Osteomyelitis (HCC)    middle phalanx, right index finger   Osteoporosis    Palpitation    Trigeminal neuralgia    Vomiting    The history was provided by the patient    Past Surgical History:  Procedure Laterality Date   CERVICAL DISC SURGERY     COLONOSCOPY     with biopsies   KNEE SURGERY     TONSILLECTOMY     TUBAL LIGATION     UPPER GASTROINTESTINAL ENDOSCOPY      with biopsies    Family History  Problem Relation Age of Onset   Hypertension Mother    Coronary artery disease Father    Breast cancer Sister 45   Prostate cancer Brother 48   Pancreatic cancer Maternal Aunt 61   Breast cancer Cousin        dx 50s    Social History   Socioeconomic History   Marital status: Married    Spouse name: Not on file   Number of children: Not on file   Years of education: Not on file   Highest education level: Bachelor's degree (e.g., BA, AB, BS)  Occupational History   Not on file  Tobacco Use   Smoking status: Never   Smokeless tobacco: Never  Substance and Sexual Activity   Alcohol use: No   Drug use: No   Sexual activity: Never    Birth control/protection: Surgical  Other Topics Concern   Not on file  Social History Narrative   Not on file   Social Drivers of Health   Financial Resource Strain: Low Risk  (11/03/2023)   Overall Financial Resource Strain (CARDIA)    Difficulty of Paying Living Expenses: Not hard at all  Food Insecurity: No Food Insecurity (11/03/2023)    Hunger Vital Sign    Worried About Running Out of Food in the Last Year: Never true    Ran Out of Food in the Last Year: Never true  Transportation Needs: No Transportation Needs (11/03/2023)   PRAPARE - Administrator, Civil Service (Medical): No    Lack of Transportation (Non-Medical): No  Physical Activity: Inactive (11/03/2023)   Exercise Vital Sign    Days of Exercise per Week: 0 days    Minutes of Exercise per Session: Not on file  Stress: No Stress Concern Present (11/03/2023)   [employer] of Occupational Health - Occupational Stress Questionnaire    Feeling of Stress: Not at all  Social Connections: Moderately Integrated (11/03/2023)  Social Advertising Account Executive    Frequency of Communication with Friends and Family: Twice a week    Frequency of Social Gatherings with Friends and Family: Once a week    Attends Religious Services: 1 to 4 times per year    Active Member of [employer] or Organizations: No    Attends Engineer, Structural: Not on file    Marital Status: Married  Catering Manager Violence: Not on file    Review of Systems  All other systems reviewed and are negative.       Objective    BP (!) 117/50   Pulse 67   Ht 5' 1 (1.549 m)   Wt 141 lb (64 kg)   SpO2 96%   BMI 26.64 kg/m   Physical Exam Constitutional:      Appearance: Normal appearance.  HENT:     Head: Normocephalic.  Cardiovascular:     Rate and Rhythm: Normal rate and regular rhythm.  Pulmonary:     Effort: Pulmonary effort is normal.     Breath sounds: Normal breath sounds.  Neurological:     General: No focal deficit present.     Mental Status: She is alert and oriented to person, place, and time.  Psychiatric:        Mood and Affect: Mood normal.        Assessment & Plan:  SABRASABRADiagnoses and all orders for this visit:  Transfer requested for continuity of care  Annual physical exam -     CBC with Differential/Platelet -     CMP14+EGFR -      Lipid panel -     TSH -     VITAMIN D  25 Hydroxy (Vit-D Deficiency, Fractures)  Screening for diabetes mellitus -     CMP14+EGFR  Lumbar spondylosis -     gabapentin  (NEURONTIN ) 300 MG capsule; Take 1 capsule (300 mg total) by mouth 4 (four) times daily.  Screening for lipid disorders -     Lipid panel  Palpitations -     nebivolol  (BYSTOLIC ) 5 MG tablet; Take 1 tablet (5 mg total) by mouth daily.  Midepigastric pain -     pantoprazole  (PROTONIX ) 40 MG tablet; Take 1 tablet (40 mg total) by mouth 2 (two) times daily.  Irritable bowel syndrome with diarrhea  Menopausal syndrome (hot flashes) -     gabapentin  (NEURONTIN ) 300 MG capsule; Take 1 capsule (300 mg total) by mouth 4 (four) times daily.  Vitals look great today Fasting labs ordered Declined flu shot and all vaccines Refills for protonix , gabapentin , bystolic  refilled today Faxed for copy of colonoscopy with [facility], Dr. Burnette.    Return in about 6 months (around 05/04/2024).   Shaquanta Harkless, PA-C

## 2023-11-06 ENCOUNTER — Encounter: Payer: Self-pay | Admitting: Physician Assistant

## 2023-11-06 ENCOUNTER — Ambulatory Visit: Payer: Self-pay | Admitting: Physician Assistant

## 2023-11-06 DIAGNOSIS — N1831 Chronic kidney disease, stage 3a: Secondary | ICD-10-CM

## 2023-11-06 LAB — CMP14+EGFR
ALT: 12 IU/L (ref 0–32)
AST: 22 IU/L (ref 0–40)
Albumin: 4.1 g/dL (ref 3.8–4.8)
Alkaline Phosphatase: 90 IU/L (ref 49–135)
BUN/Creatinine Ratio: 10 — ABNORMAL LOW (ref 12–28)
BUN: 10 mg/dL (ref 8–27)
Bilirubin Total: 0.3 mg/dL (ref 0.0–1.2)
CO2: 21 mmol/L (ref 20–29)
Calcium: 8.8 mg/dL (ref 8.7–10.3)
Chloride: 103 mmol/L (ref 96–106)
Creatinine, Ser: 1.02 mg/dL — ABNORMAL HIGH (ref 0.57–1.00)
Globulin, Total: 3.2 g/dL (ref 1.5–4.5)
Glucose: 87 mg/dL (ref 70–99)
Potassium: 4.3 mmol/L (ref 3.5–5.2)
Sodium: 139 mmol/L (ref 134–144)
Total Protein: 7.3 g/dL (ref 6.0–8.5)
eGFR: 58 mL/min/1.73 — ABNORMAL LOW (ref >59)

## 2023-11-06 LAB — LIPID PANEL
Chol/HDL Ratio: 3.3 ratio (ref 0.0–4.4)
Cholesterol, Total: 167 mg/dL (ref 100–199)
HDL: 50 mg/dL (ref >39)
LDL Chol Calc (NIH): 99 mg/dL (ref 0–99)
Triglycerides: 95 mg/dL (ref 0–149)
VLDL Cholesterol Cal: 18 mg/dL (ref 5–40)

## 2023-11-06 LAB — CBC WITH DIFFERENTIAL/PLATELET
Basophils Absolute: 0.1 x10E3/uL (ref 0.0–0.2)
Basos: 1 %
EOS (ABSOLUTE): 0.2 x10E3/uL (ref 0.0–0.4)
Eos: 3 %
Hematocrit: 42.5 % (ref 34.0–46.6)
Hemoglobin: 14.2 g/dL (ref 11.1–15.9)
Immature Grans (Abs): 0 x10E3/uL (ref 0.0–0.1)
Immature Granulocytes: 0 %
Lymphocytes Absolute: 2.1 x10E3/uL (ref 0.7–3.1)
Lymphs: 29 %
MCH: 30.7 pg (ref 26.6–33.0)
MCHC: 33.4 g/dL (ref 31.5–35.7)
MCV: 92 fL (ref 79–97)
Monocytes Absolute: 0.6 x10E3/uL (ref 0.1–0.9)
Monocytes: 9 %
Neutrophils Absolute: 4.2 x10E3/uL (ref 1.4–7.0)
Neutrophils: 57 %
Platelets: 303 x10E3/uL (ref 150–450)
RBC: 4.62 x10E6/uL (ref 3.77–5.28)
RDW: 13 % (ref 11.7–15.4)
WBC: 7.3 x10E3/uL (ref 3.4–10.8)

## 2023-11-06 LAB — TSH: TSH: 1.22 u[IU]/mL (ref 0.450–4.500)

## 2023-11-06 LAB — VITAMIN D 25 HYDROXY (VIT D DEFICIENCY, FRACTURES): Vit D, 25-Hydroxy: 32.5 ng/mL (ref 30.0–100.0)

## 2023-11-06 NOTE — Progress Notes (Signed)
 Kathleen Garcia,   Vitamin D  low normal. Could increase by 1000 units daily.  Thyroid  looks good.  Glucose and liver look good.  Kidney function stable at CKD 3a. There is a medication indicated for chronic kidney diease stability called farxiga. Would you want to talk more about this?  Cholesterol below 100 and to goal.

## 2023-11-11 MED ORDER — DAPAGLIFLOZIN PROPANEDIOL 10 MG PO TABS: 10.0000 mg | ORAL_TABLET | Freq: Every day | ORAL | 0 refills | Status: DC

## 2023-11-12 NOTE — Addendum Note (Signed)
 Addended by: Katerina Zurn P on: 11/12/2023 03:58 PM   Modules accepted: Orders

## 2023-11-12 NOTE — Telephone Encounter (Signed)
 Added BMP orders to patient chart for rechecking in 3 months

## 2023-11-12 NOTE — Telephone Encounter (Signed)
 Please pend bmp for 3 months.

## 2024-01-23 ENCOUNTER — Telehealth: Payer: Self-pay

## 2024-01-23 NOTE — Telephone Encounter (Signed)
 Copied from CRM #[id number]. Topic: Clinical - Request for Lab/Test Order >> Jan 23, 2024 10:21 AM Mercer PEDLAR wrote: Reason for CRM: Patient was told by PCP that she needs retesting before finishing dapagliflozin  propanediol (FARXIGA ) 10 MG TABS tablet. Patient wants to make sure that order was placed before coming to lab. Please call back to confirm.

## 2024-01-23 NOTE — Telephone Encounter (Signed)
 Spoke with patient and advised she needed to come in 3 months from 11/12/23. Patient is aware and stated she only has 14 tablets left she didn't know if you wanted to refill it to hold her over until she comes in to have labs drawn. Pls advise

## 2024-01-24 MED ORDER — DAPAGLIFLOZIN PROPANEDIOL 10 MG PO TABS: 10.0000 mg | ORAL_TABLET | Freq: Every day | ORAL | 0 refills | Status: AC

## 2024-01-24 NOTE — Telephone Encounter (Signed)
 I went ahead and sent refill. Cmp to order for lab draw.

## 2024-01-24 NOTE — Telephone Encounter (Signed)
 Please order bmp for patient to come into lab and have drawn.

## 2024-02-04 ENCOUNTER — Encounter: Payer: Self-pay | Admitting: Physician Assistant
